# Patient Record
Sex: Male | Born: 1955 | Race: Black or African American | Hispanic: No | Marital: Married | Smoking: Never smoker
Health system: Southern US, Community
[De-identification: ages and names within clinical notes are randomized; demographics above are authoritative.]

## PROBLEM LIST (undated history)

## (undated) DIAGNOSIS — I10 Essential (primary) hypertension: Secondary | ICD-10-CM

## (undated) DIAGNOSIS — E875 Hyperkalemia: Secondary | ICD-10-CM

## (undated) DIAGNOSIS — E119 Type 2 diabetes mellitus without complications: Secondary | ICD-10-CM

## (undated) DIAGNOSIS — E785 Hyperlipidemia, unspecified: Secondary | ICD-10-CM

## (undated) DIAGNOSIS — E663 Overweight: Secondary | ICD-10-CM

## (undated) DIAGNOSIS — M109 Gout, unspecified: Secondary | ICD-10-CM

## (undated) DIAGNOSIS — M2141 Flat foot [pes planus] (acquired), right foot: Secondary | ICD-10-CM

## (undated) DIAGNOSIS — K635 Polyp of colon: Secondary | ICD-10-CM

## (undated) DIAGNOSIS — N189 Chronic kidney disease, unspecified: Secondary | ICD-10-CM

## (undated) HISTORY — PX: HERNIA REPAIR: SHX51

## (undated) HISTORY — DX: Chronic kidney disease, unspecified: N18.9

## (undated) HISTORY — DX: Flat foot (pes planus) (acquired), right foot: M21.41

## (undated) HISTORY — DX: Polyp of colon: K63.5

## (undated) HISTORY — DX: Type 2 diabetes mellitus without complications: E11.9

## (undated) HISTORY — DX: Gout, unspecified: M10.9

## (undated) HISTORY — DX: Essential (primary) hypertension: I10

## (undated) HISTORY — PX: COLONOSCOPY: SHX174

## (undated) HISTORY — DX: Overweight: E66.3

## (undated) HISTORY — DX: Hyperkalemia: E87.5

## (undated) HISTORY — DX: Hyperlipidemia, unspecified: E78.5

---

## 2009-01-10 ENCOUNTER — Ambulatory Visit: Payer: Self-pay | Admitting: General Surgery

## 2009-01-17 ENCOUNTER — Ambulatory Visit: Payer: Self-pay | Admitting: General Surgery

## 2013-03-23 ENCOUNTER — Ambulatory Visit: Payer: Self-pay | Admitting: General Practice

## 2013-03-23 IMAGING — CR DG KNEE COMPLETE 4+V*R*
1 series · 5 of 5 positions shown · non-contrast
Comparison: none

REASON FOR EXAM: pain in Rt knee injury fax result to Dr. CELESTINE
COMMENTS:

[Series 1: t knee obl right · 0.14mm/px · 5 of 5 slices shown]
[im 1/5]
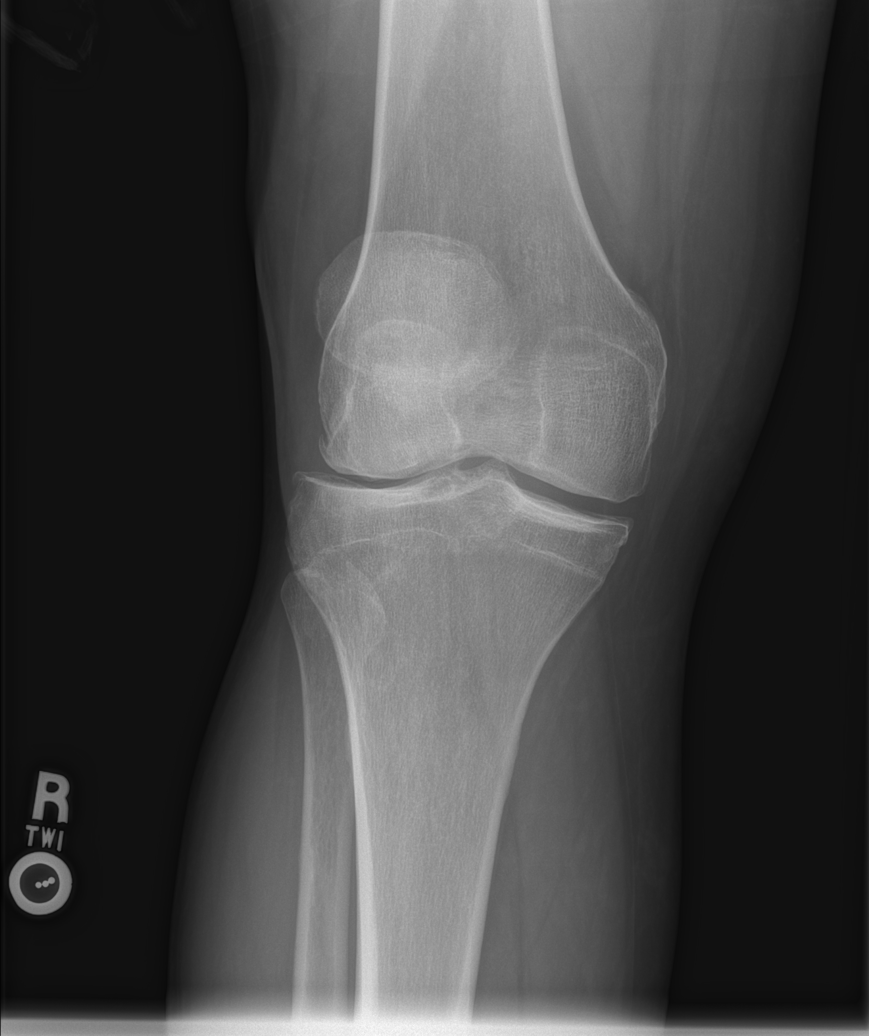
[im 2/5]
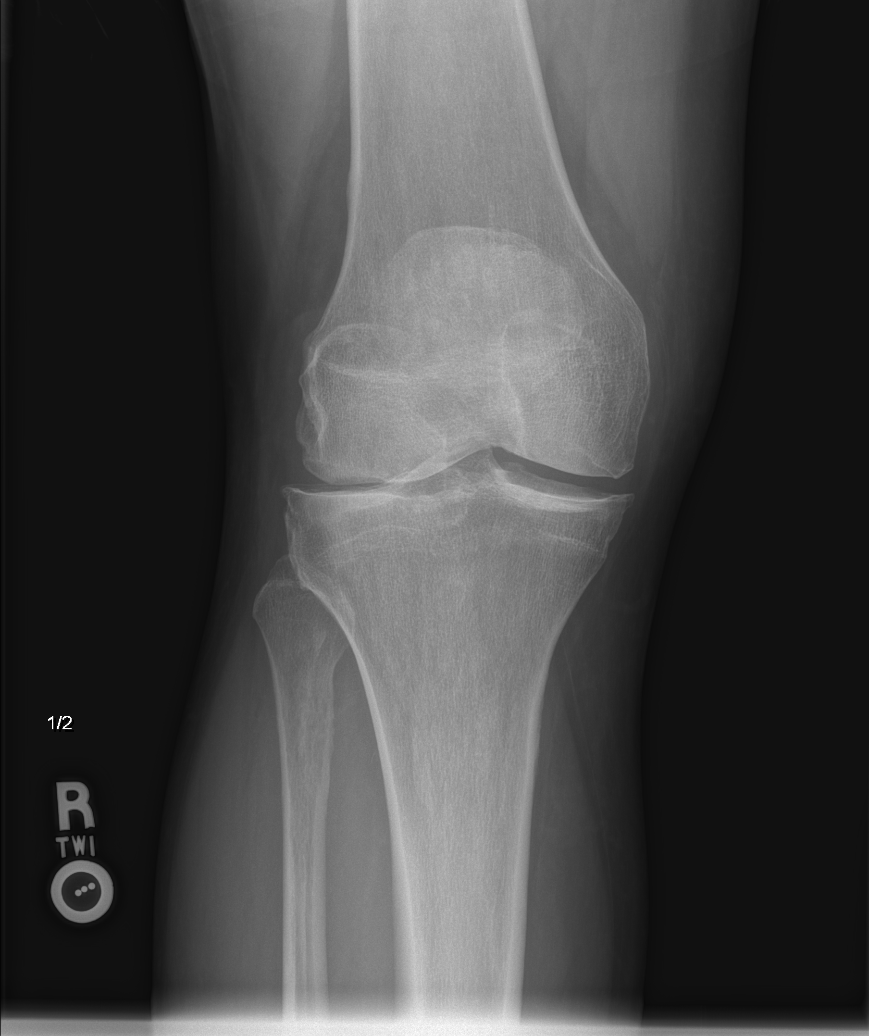
[im 3/5]
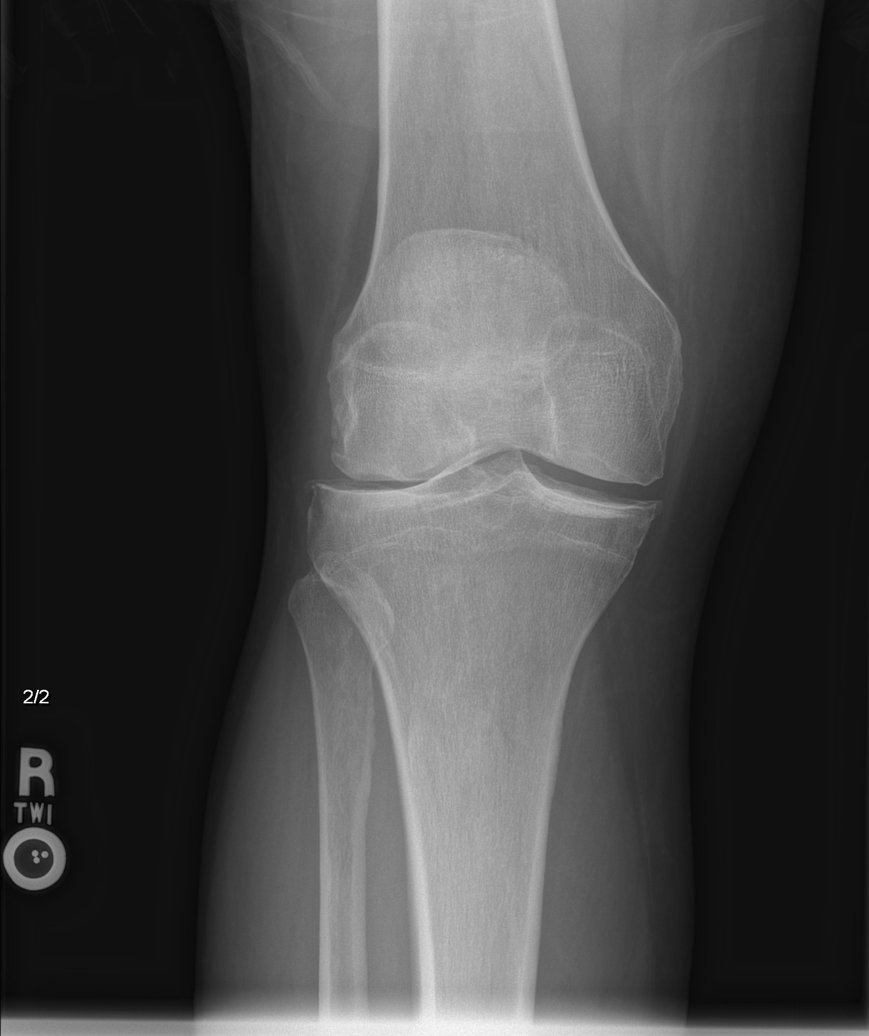
[im 4/5]
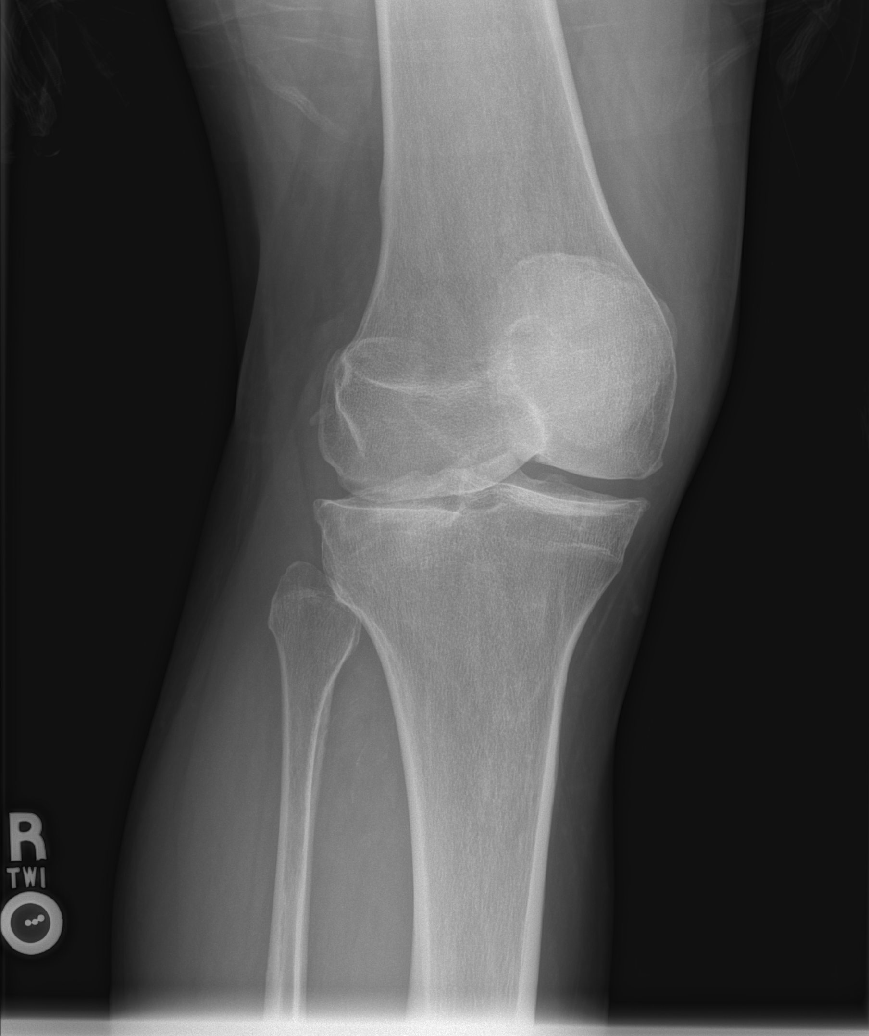
[im 5/5]
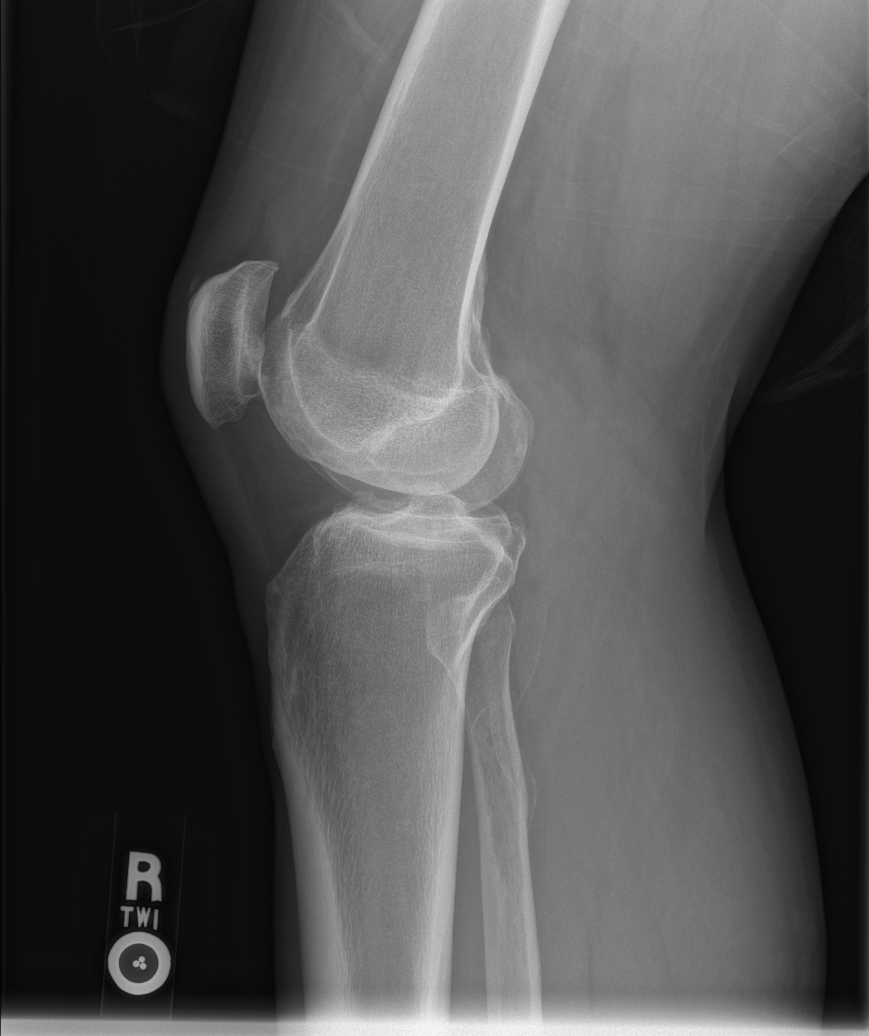

[5 of 5 positions shown; findings below may reference images not displayed]

PROCEDURE:     DXR - DXR KNEE RT COMP WITH OBLIQUES  - [DATE]  [DATE]

RESULT:     Right knee images demonstrate significant atherosclerotic
calcification. There is some degenerative joint space narrowing especially
medially and in the patellofemoral region without evidence of fracture,
dislocation or foreign body.
IMPRESSION: Please see above.

[REDACTED]

## 2015-04-25 ENCOUNTER — Ambulatory Visit (INDEPENDENT_AMBULATORY_CARE_PROVIDER_SITE_OTHER): Payer: BLUE CROSS/BLUE SHIELD | Admitting: Podiatry

## 2015-04-25 ENCOUNTER — Ambulatory Visit (INDEPENDENT_AMBULATORY_CARE_PROVIDER_SITE_OTHER): Payer: BLUE CROSS/BLUE SHIELD

## 2015-04-25 ENCOUNTER — Encounter: Payer: Self-pay | Admitting: Podiatry

## 2015-04-25 VITALS — BP 137/88 | HR 80 | Resp 16 | Ht 71.0 in | Wt 262.0 lb

## 2015-04-25 DIAGNOSIS — M2142 Flat foot [pes planus] (acquired), left foot: Secondary | ICD-10-CM

## 2015-04-25 DIAGNOSIS — M19072 Primary osteoarthritis, left ankle and foot: Secondary | ICD-10-CM | POA: Diagnosis not present

## 2015-04-25 DIAGNOSIS — R52 Pain, unspecified: Secondary | ICD-10-CM

## 2015-04-25 DIAGNOSIS — M2141 Flat foot [pes planus] (acquired), right foot: Secondary | ICD-10-CM | POA: Diagnosis not present

## 2015-04-25 MED ORDER — MELOXICAM 7.5 MG PO TABS: 7.5000 mg | ORAL_TABLET | Freq: Every day | ORAL | Status: DC

## 2015-04-25 NOTE — Progress Notes (Signed)
Subjective:     Patient ID: Ernest [name], male   DOB: Jan 10, 1956, 59 y.o.   MRN: [id number]  HPI 59 year old male presents the office today for complaints of left foot pain which is been ongoing for greater than 6 months. It's that she has pain in the top of his foot mostly outside aspect which is mostly when trimmed put pressure on an prolonged ambulation. He denies any history of injury or trauma to the area. He denies any swelling or redness. Denies any numbness or tingling. He is diabetic and states his last blood sugar check yesterday was approximately 130. He said no prior treatment other than taking Advil which seems to help. No other complaints at this time.  Review of Systems  All other systems reviewed and are negative.      Objective:   Physical Exam AAO x3, NAD DP/PT pulses palpable bilaterally, CRT less than 3 seconds Protective sensation intact with Simms Weinstein monofilament, vibratory sensation intact, Achilles tendon reflex intact There is tenderness to palpation on the dorsal aspect of the midfoot along Lisfranc's joint was on the lateral aspect on the left side. There is no specific area pinpoint bony tenderness or pain the vibratory sensation. There is no overlying edema, erythema, increase in warmth bilaterally. No other areas of tenderness to bilateral lower extremities. There is a decrease in medial arch height upon weightbearing. Equinus is present. MMT 5/5, ROM WNL.  No open lesions or pre-ulcerative lesions.  No pain with calf compression, swelling, warmth, erythema bilaterally.      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 Arthritic changes are present on the midfoot. There is also some vessel calcification present. He does have palpable pulses and pedal hair is present. Circulation appears to be adequate. However he continues to have symptoms will  likely have arterial studies to confirm circulation. -Treatment options discussed including all alternatives, risks, and complications -Discussed likely etiology of his symptoms. -I would that he would benefit from arch supports to help support the arch of the foot to help take pressure off the midfoot. -I discussed steroid injection over the area of maximal alternatives. I discussed risks and come locations for which she understands and wishes to proceed. Under sterile conditions a total of 1 mL mixture of dexamethasone phosphate and 0.5% Marcaine plain was infiltrated into the area of maximal tenderness on the dorsal aspect of the midfoot. A Band-Aid was applied. Postinjection care was discussed the patient. Monitor blood sugar. -Prescribed mobic. Discussed side effects of the medication and directed to stop if any are to occur and call the office.  -Follow-up 6 weeks or sooner if any problems arise. In the meantime, encouraged to call the office with any questions, concerns, change in symptoms.   Ovid Curd, DPM

## 2015-06-06 ENCOUNTER — Ambulatory Visit: Payer: BLUE CROSS/BLUE SHIELD | Admitting: Podiatry

## 2015-06-06 ENCOUNTER — Ambulatory Visit (INDEPENDENT_AMBULATORY_CARE_PROVIDER_SITE_OTHER): Payer: BLUE CROSS/BLUE SHIELD | Admitting: Podiatry

## 2015-06-06 ENCOUNTER — Encounter: Payer: Self-pay | Admitting: Podiatry

## 2015-06-06 VITALS — BP 132/83 | HR 105 | Resp 18

## 2015-06-06 DIAGNOSIS — M2040 Other hammer toe(s) (acquired), unspecified foot: Secondary | ICD-10-CM

## 2015-06-06 DIAGNOSIS — M19072 Primary osteoarthritis, left ankle and foot: Secondary | ICD-10-CM

## 2015-06-06 DIAGNOSIS — M79673 Pain in unspecified foot: Secondary | ICD-10-CM

## 2015-06-06 NOTE — Progress Notes (Signed)
Patient ID: Ernest [name], male   DOB: 08/21/1956, 59 y.o.   MRN: [id number]  Subjective: 59 year old male presents the office they for follow-up evaluation of left foot pain. He states that he had significant improvement in his pain after the injection lasted up until about one week ago. He states his pain is moved more towards his toes at this time. He has pain to the top of his toes protective weightbearing and pressure. Denies any recent injury or trauma. No swelling or redness. No other complaints at this time. He has not yet purchased inserts.  Objective: AAO 3, NAD DP/PT pulses palpable, CRT less than 3 seconds Protective sensation intact with Simms Weinsteinmonofilament There is tenderness on the dorsal aspect of the MTPJ left foot, without any pain on the plantar aspect. There is no pain with MPJ range of motion. There is rigid hammertoe contractures of the lesser digits. There is prominent metatarsal heads with atrophy of the fat pad. There is no area pinpoint bony tenderness or pain the vibratory sensation.there is no pain on the dorsal midfoot that was present last appointment. There is no overlying edema, erythema, increase in warmth.no open lesions or pre-ulcerative lesions. No pain with calf compression, swelling, warmth, erythema.  Assessment: 59 year old male with MTPJ pain likely as a result of rigid hammertoe contractures/deformity.  Plan: -Treatment options discussed including all alternatives, risks, and complications -metatarsal pads applied. -Again I discussed with him orthotics as ably this will help control his foot structure and help take pressure off the metatarsal heads. He did purchase power steps today. Dispense metatarsal pads. -Anti-inflammatories as needed -Follow-up in 4 weeks if symptoms continue or sooner if any problems arise. In the meantime, encouraged to call the office with any questions, concerns, change in symptoms.   Ovid Curd, DPM

## 2015-07-18 ENCOUNTER — Ambulatory Visit: Payer: BLUE CROSS/BLUE SHIELD | Admitting: Podiatry

## 2015-07-20 ENCOUNTER — Encounter: Payer: Self-pay | Admitting: Podiatry

## 2015-07-20 ENCOUNTER — Ambulatory Visit (INDEPENDENT_AMBULATORY_CARE_PROVIDER_SITE_OTHER): Payer: BLUE CROSS/BLUE SHIELD | Admitting: Podiatry

## 2015-07-20 VITALS — BP 129/86 | HR 87 | Resp 18

## 2015-07-20 DIAGNOSIS — M2040 Other hammer toe(s) (acquired), unspecified foot: Secondary | ICD-10-CM

## 2015-07-20 DIAGNOSIS — M779 Enthesopathy, unspecified: Secondary | ICD-10-CM | POA: Diagnosis not present

## 2015-07-20 MED ORDER — DICLOFENAC SODIUM 75 MG PO TBEC: 75.0000 mg | DELAYED_RELEASE_TABLET | Freq: Two times a day (BID) | ORAL | Status: DC

## 2015-07-20 NOTE — Progress Notes (Signed)
Patient ID: Ernest [name], male   DOB: 11/05/1955, 59 y.o.   MRN: [id number]   Subjective: 59 year old male presents the office today for follow up evaluation of left foot pain. he states that overall he is doing better. He has had some good days and bad days. He does continue of pain to the ball of his foot mostly along the third toe joint. He does continue with the orthotics and the pads which he states helps. He denies any recent injury or trauma. No swelling or redness. No other complaints at this time.  Objective: AAO 3, NAD DP/PT pulses 2/4, CRT less than 3 seconds Protective sensation intact with Simms Weinstein monofilament There is continued tenderness the dorsal aspect of the MPJ of the left foot mostly on the third MPJ. There is no pain or crepitation MPJ range of motion. There is no specific area pinpoint bony tenderness or pain the vibratory sensation. There is no overlying edema, erythema, increase in warmth bilaterally. MMT 5/5, ROM WNL. Hammertoe contractures present.  No open lesions or pre-ulcer lesions identified bilaterally. No pain with calf compression, swelling, warmth, erythema.  Assessment: 59 year old male capsulitis, metatarsalgia  Plan: -Treatment options discussed including all alternatives, risks, and complications -I discussed a steroid injection around the area of maximal tenderness. I discussed with him risks and complications for which she understands and verbally consents. Under sterile conditions a total of 1 mL mixture of dexamethasone phosphate and 2% lidocaine plain was infiltrated into and around the third MTPJ on the area of maximal tenderness without any couple complications. He tolerated the injection well any, complications. Post injection care discussed. -Continue with inserts offloading pads. -Follow up with Simms does not resolve the next 4-6 weeks or sooner if any palms are to arise. Otherwise follow-up as needed. Call any questions or concerns in  the meantime.  Ovid CurdMatthew Wagoner, DPM

## 2015-08-10 ENCOUNTER — Other Ambulatory Visit: Payer: Self-pay | Admitting: Podiatry

## 2015-08-10 NOTE — Telephone Encounter (Signed)
Pt needs an appt prior to refills if continuing to have problems. 

## 2015-08-31 ENCOUNTER — Ambulatory Visit: Payer: BLUE CROSS/BLUE SHIELD | Admitting: Podiatry

## 2016-03-13 ENCOUNTER — Other Ambulatory Visit: Payer: Self-pay | Admitting: Physician Assistant

## 2016-07-11 IMAGING — US US EXTREM LOW VENOUS*L*
1 series · 13 of 24 positions shown · non-contrast
Comparison: None.

CLINICAL DATA: Left leg pain and swelling for 1 week.



[Series 1: us extrem low venous*left* · 0.08mm/px · 13 of 44 slices shown]
[im 1/44]
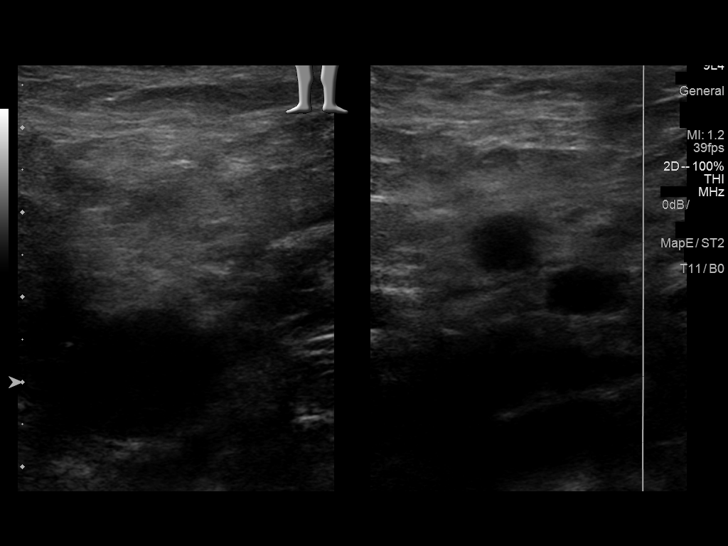
[im 4/44]
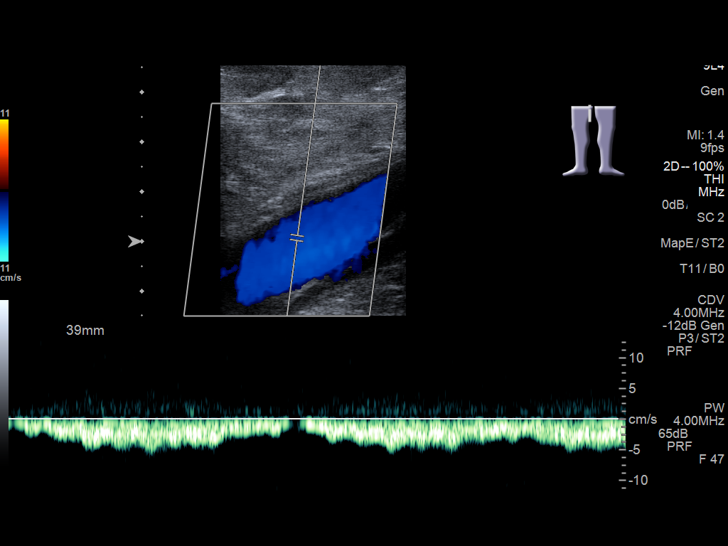
[im 8/44]
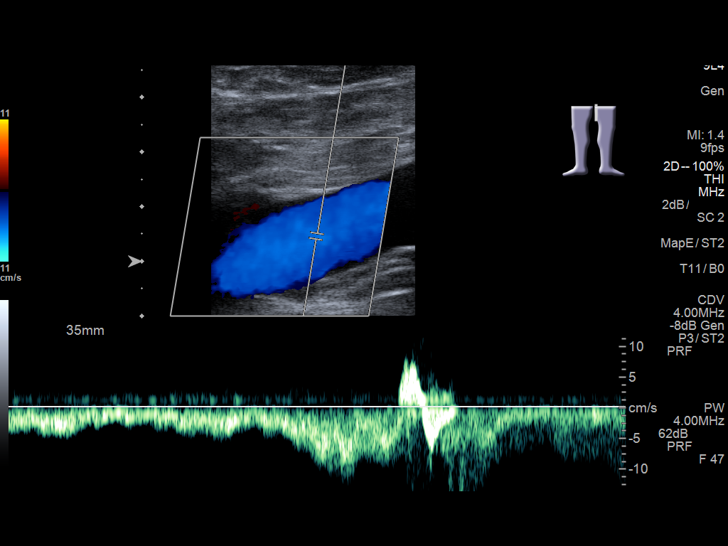
[im 12/44]
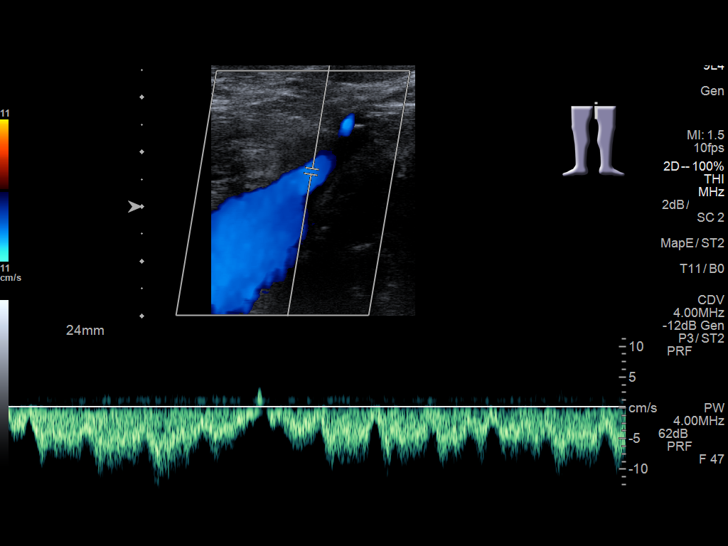
[im 15/44]
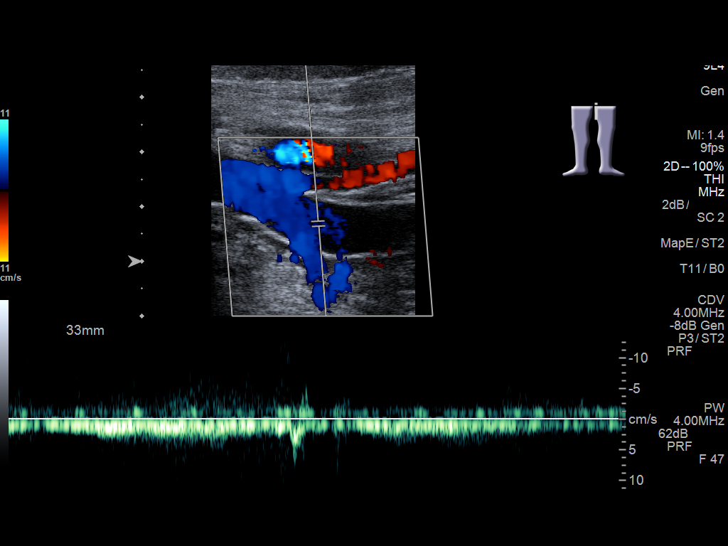
[im 19/44]
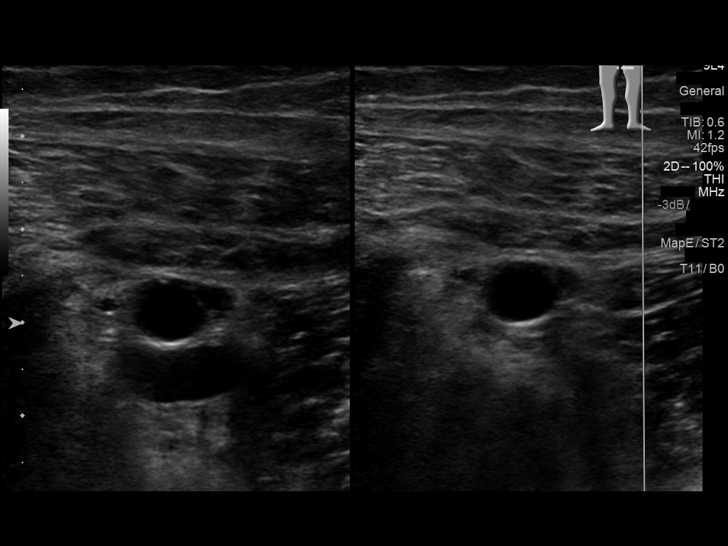
[im 23/44]
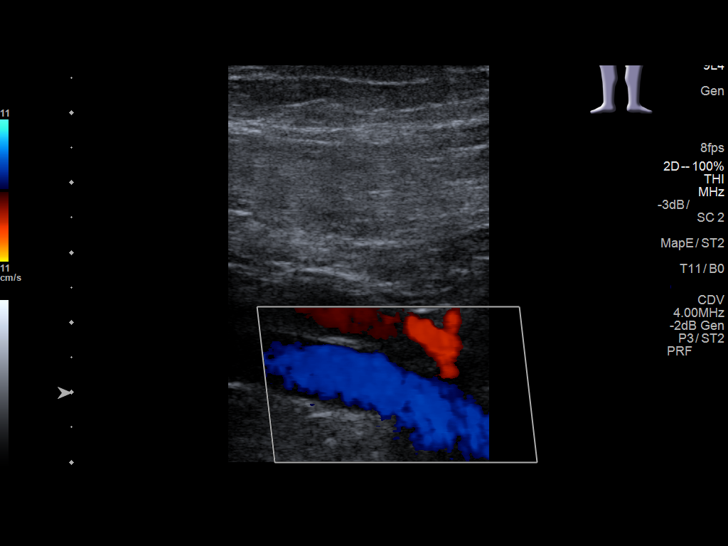
[im 25/44]
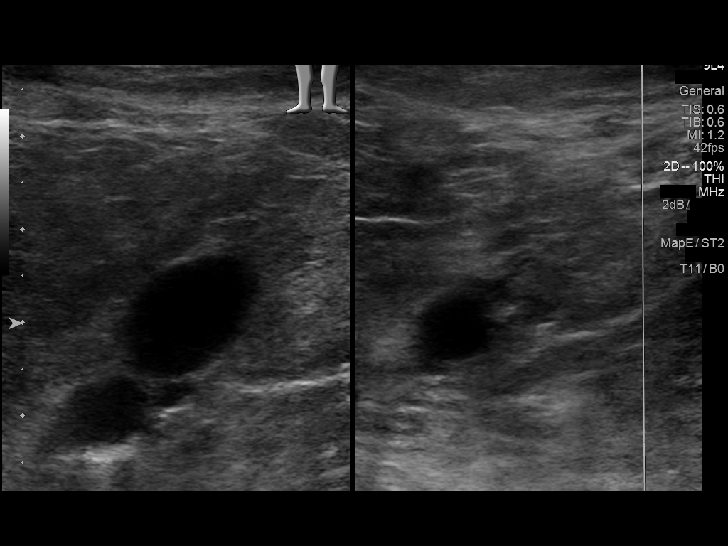
[im 29/44]
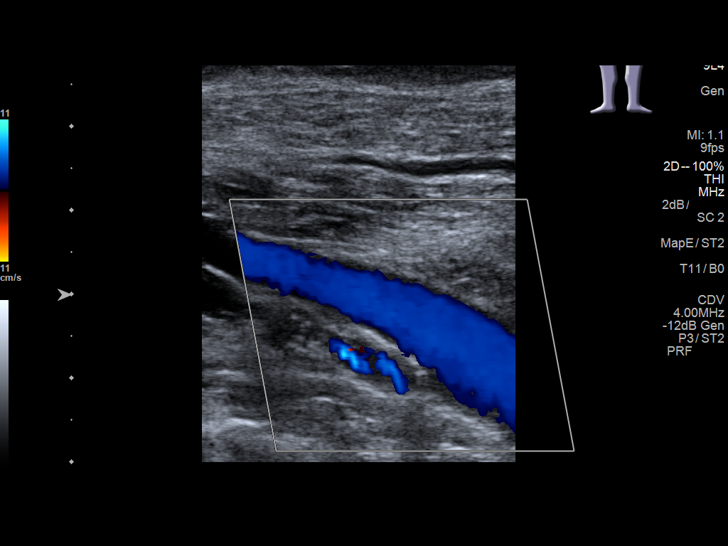
[im 32/44]
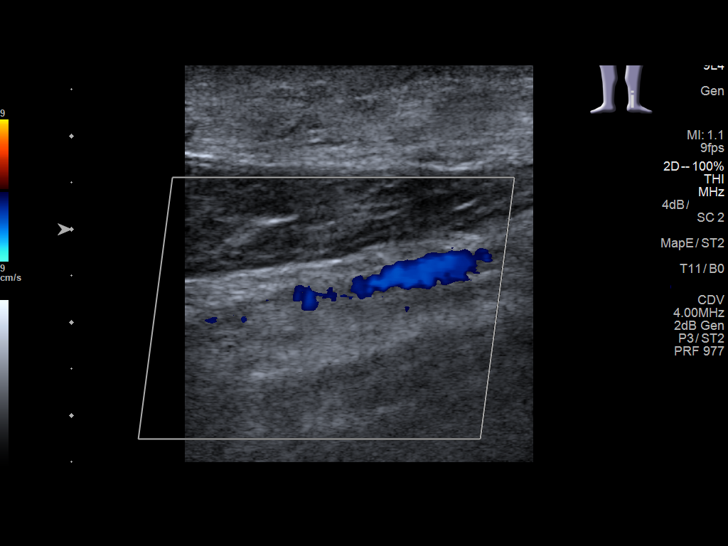
[im 36/44]
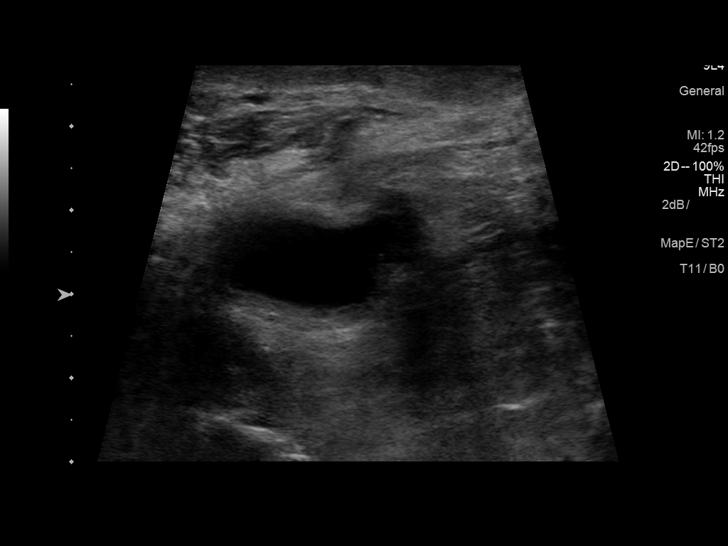
[im 40/44]
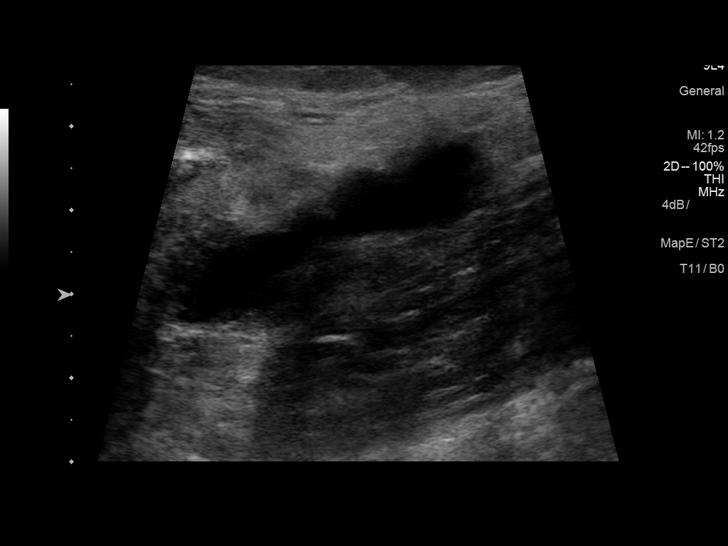
[im 44/44]
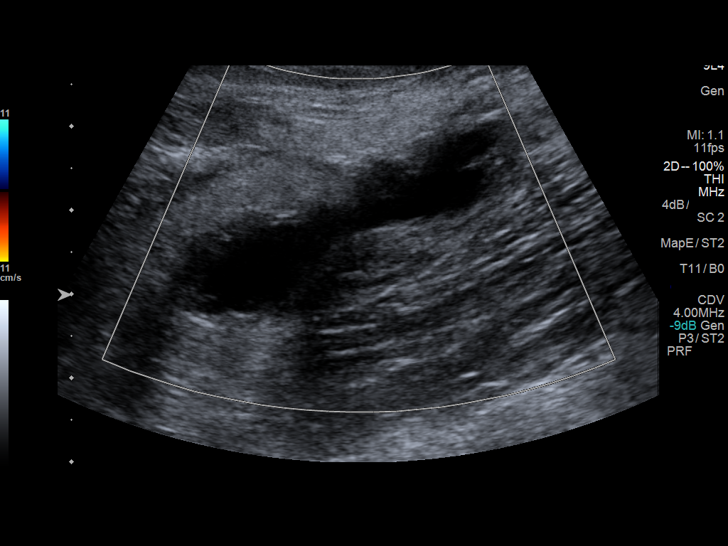

[13 of 24 positions shown; findings below may reference images not displayed]

FINDINGS: Contralateral Common Femoral Vein: Respiratory phasicity is normal
and symmetric with the symptomatic side. No evidence of thrombus.
Normal compressibility.

Common Femoral Vein: No evidence of thrombus. Normal
compressibility, respiratory phasicity and response to augmentation.

Saphenofemoral Junction: No evidence of thrombus. Normal
compressibility and flow on color Doppler imaging.

Profunda Femoral Vein: No evidence of thrombus. Normal
compressibility and flow on color Doppler imaging.

Femoral Vein: No evidence of thrombus. Normal compressibility,
respiratory phasicity and response to augmentation.

Popliteal Vein: No evidence of thrombus. Normal compressibility,
respiratory phasicity and response to augmentation.

Calf Veins: No evidence of thrombus. Normal compressibility and flow
on color Doppler imaging.

Superficial Great Saphenous Vein: No evidence of thrombus. Normal
compressibility and flow on color Doppler imaging.

Venous Reflux:  None.

Other Findings: Probable Baker's cyst measuring 9.0 x 4.1 x 2.5 cm
is noted.
IMPRESSION: No evidence of deep venous thrombosis seen in left lower extremity.
Baker's cyst seen in left popliteal fossa.

## 2016-09-25 ENCOUNTER — Emergency Department
Admission: EM | Admit: 2016-09-25 | Discharge: 2016-09-25 | Disposition: A | Discharge status: Home / Self Care | Payer: BLUE CROSS/BLUE SHIELD | Attending: Emergency Medicine | Admitting: Emergency Medicine

## 2016-09-25 ENCOUNTER — Emergency Department: Payer: BLUE CROSS/BLUE SHIELD

## 2016-09-25 DIAGNOSIS — M660 Rupture of popliteal cyst: Secondary | ICD-10-CM | POA: Insufficient documentation

## 2016-09-25 DIAGNOSIS — Z7984 Long term (current) use of oral hypoglycemic drugs: Secondary | ICD-10-CM | POA: Diagnosis not present

## 2016-09-25 DIAGNOSIS — Z79899 Other long term (current) drug therapy: Secondary | ICD-10-CM | POA: Insufficient documentation

## 2016-09-25 DIAGNOSIS — I10 Essential (primary) hypertension: Secondary | ICD-10-CM | POA: Insufficient documentation

## 2016-09-25 DIAGNOSIS — M7989 Other specified soft tissue disorders: Secondary | ICD-10-CM | POA: Diagnosis present

## 2016-09-25 DIAGNOSIS — E119 Type 2 diabetes mellitus without complications: Secondary | ICD-10-CM | POA: Insufficient documentation

## 2016-09-25 MED ORDER — OXYCODONE-ACETAMINOPHEN 5-325 MG PO TABS: 2.0000 | ORAL_TABLET | Freq: Four times a day (QID) | ORAL | 0 refills | Status: DC | PRN

## 2016-09-25 NOTE — ED Triage Notes (Signed)
Reports left leg swelling and pain in calf. +PMS to foot.

## 2016-09-25 NOTE — ED Provider Notes (Signed)
[facility] Emergency Department Provider Note        Time seen: ----------------------------------------- 7:27 PM on 09/25/2016 -----------------------------------------    I have reviewed the triage vital signs and the nursing notes.   HISTORY  Chief Complaint Leg Pain    HPI Ernest [name] is a 60 y.o. male who presents to the ER for left leg swelling and pain in his calf.Pain is been present for several days, initially he states it felt like a cramp in the lower part of his upper leg on the left. Patient states he tried to stretch it but the pain persisted. He notes swelling in the left leg that worse when he is up and walking. Patient states when he lays down at night the fluid seems to go away. His also had bruising in the left lower leg and ankle   Past Medical History:  Diagnosis Date  . Diabetes mellitus without complication (HCC)   . Hypertension     There are no active problems to display for this patient.   No past surgical history on file.  Allergies Patient has no known allergies.  Social History Social History  Substance Use Topics  . Smoking status: Never Smoker  . Smokeless tobacco: Not on file  . Alcohol use Not on file    Review of Systems Constitutional: Negative for fever. Cardiovascular: Negative for chest pain. Respiratory: Negative for shortness of breath. Gastrointestinal: Negative for abdominal pain, vomiting and diarrhea. Musculoskeletal: Positive for left leg swelling and pain Skin: Positive for ecchymosis Neurological: Negative for headaches, focal weakness or numbness.  10-point ROS otherwise negative.  ____________________________________________   PHYSICAL EXAM:  VITAL SIGNS: ED Triage Vitals  Enc Vitals Group     BP 09/25/16 1708 (!) 143/92     Pulse Rate 09/25/16 1708 95     Resp 09/25/16 1708 18     Temp 09/25/16 1708 98.8 F (37.1 C)     Temp Source 09/25/16 1708 Oral     SpO2 09/25/16  1708 98 %     Weight 09/25/16 1706 265 lb (120.2 kg)     Height 09/25/16 1706 5\' 9"  (1.753 m)     Head Circumference --      Peak Flow --      Pain Score 09/25/16 1707 3     Pain Loc --      Pain Edu? --      Excl. in GC? --     Constitutional: Alert and oriented. Well appearing and in no distress. Eyes: Conjunctivae are normal. PERRL. Normal extraocular movements. ENT   Head: Normocephalic and atraumatic.   Nose: No congestion/rhinnorhea.   Mouth/Throat: Mucous membranes are moist.   Neck: No stridor. Cardiovascular: Normal rate, regular rhythm. No murmurs, rubs, or gallops. Respiratory: Normal respiratory effort without tachypnea nor retractions. Breath sounds are clear and equal bilaterally. No wheezes/rales/rhonchi. Gastrointestinal: Soft and nontender. Normal bowel sounds Musculoskeletal: Mild pain with range of motion of lower extremity. Tenderness over the ankle, ecchymosis and pitting edema as noted in the left lower leg. He has ecchymosis noted around the left ankle, also present around the distal left upper leg posteriorly Neurologic:  Normal speech and language. No gross focal neurologic deficits are appreciated.  Skin:  Ecchymosis as dictated above ____________________________________________  ED COURSE:  Pertinent labs & imaging results that were available during my care of the patient were reviewed by me and considered in my medical decision making (see chart for details). Clinical Course  Patient presents to ER for left leg edema and ecchymosis. We will assess with ultrasound and reevaluate.  Procedures ____________________________________________   RADIOLOGY Lower extremity ultrasound IMPRESSION: No evidence of deep venous thrombosis seen in left lower extremity. Baker's cyst seen in left popliteal fossa.  ____________________________________________  FINAL ASSESSMENT AND PLAN  Baker's cyst  Plan: Patient with labs and imaging as dictated  above. Patient likely with ruptured Baker's cyst and symptoms secondary to same. He will be referred to orthopedics for outpatient follow-up. Right lower extremity is otherwise neurovascularly intact. He is stable for discharge.   Emily FilbertWilliams, Safari Cinque E, MD   Note: This dictation was prepared with Dragon dictation. Any transcriptional errors that result from this process are unintentional    Emily FilbertJonathan E Tyerra Loretto, MD 09/25/16 1945

## 2016-09-26 ENCOUNTER — Other Ambulatory Visit: Payer: Self-pay | Admitting: Physician Assistant

## 2016-10-08 ENCOUNTER — Other Ambulatory Visit: Payer: Self-pay | Admitting: Physician Assistant

## 2017-07-02 ENCOUNTER — Ambulatory Visit
Admission: RE | Admit: 2017-07-02 | Discharge: 2017-07-02 | Disposition: A | Discharge status: Home / Self Care | Payer: BLUE CROSS/BLUE SHIELD | Source: Ambulatory Visit | Attending: Family Medicine | Admitting: Family Medicine

## 2017-07-02 ENCOUNTER — Other Ambulatory Visit: Payer: Self-pay | Admitting: Family Medicine

## 2017-07-02 DIAGNOSIS — M65811 Other synovitis and tenosynovitis, right shoulder: Secondary | ICD-10-CM | POA: Diagnosis not present

## 2017-07-02 DIAGNOSIS — M25551 Pain in right hip: Secondary | ICD-10-CM | POA: Diagnosis present

## 2017-07-02 DIAGNOSIS — M25511 Pain in right shoulder: Secondary | ICD-10-CM | POA: Insufficient documentation

## 2017-07-02 DIAGNOSIS — M1611 Unilateral primary osteoarthritis, right hip: Secondary | ICD-10-CM | POA: Diagnosis not present

## 2017-07-02 IMAGING — CR DG HIP (WITH OR WITHOUT PELVIS) 2-3V*R*
1 series · 3 of 3 positions shown · non-contrast
Comparison: None.

CLINICAL DATA: Right hip pain, 8 months duration.

EXAM:
DG HIP (WITH OR WITHOUT PELVIS) 2-3V RIGHT

[Series 1: dg hip unilat w or w/o pelvis 2-3 views  · non-contrast · 0.14mm/px · 3 of 3 slices shown]
[im 1/3]
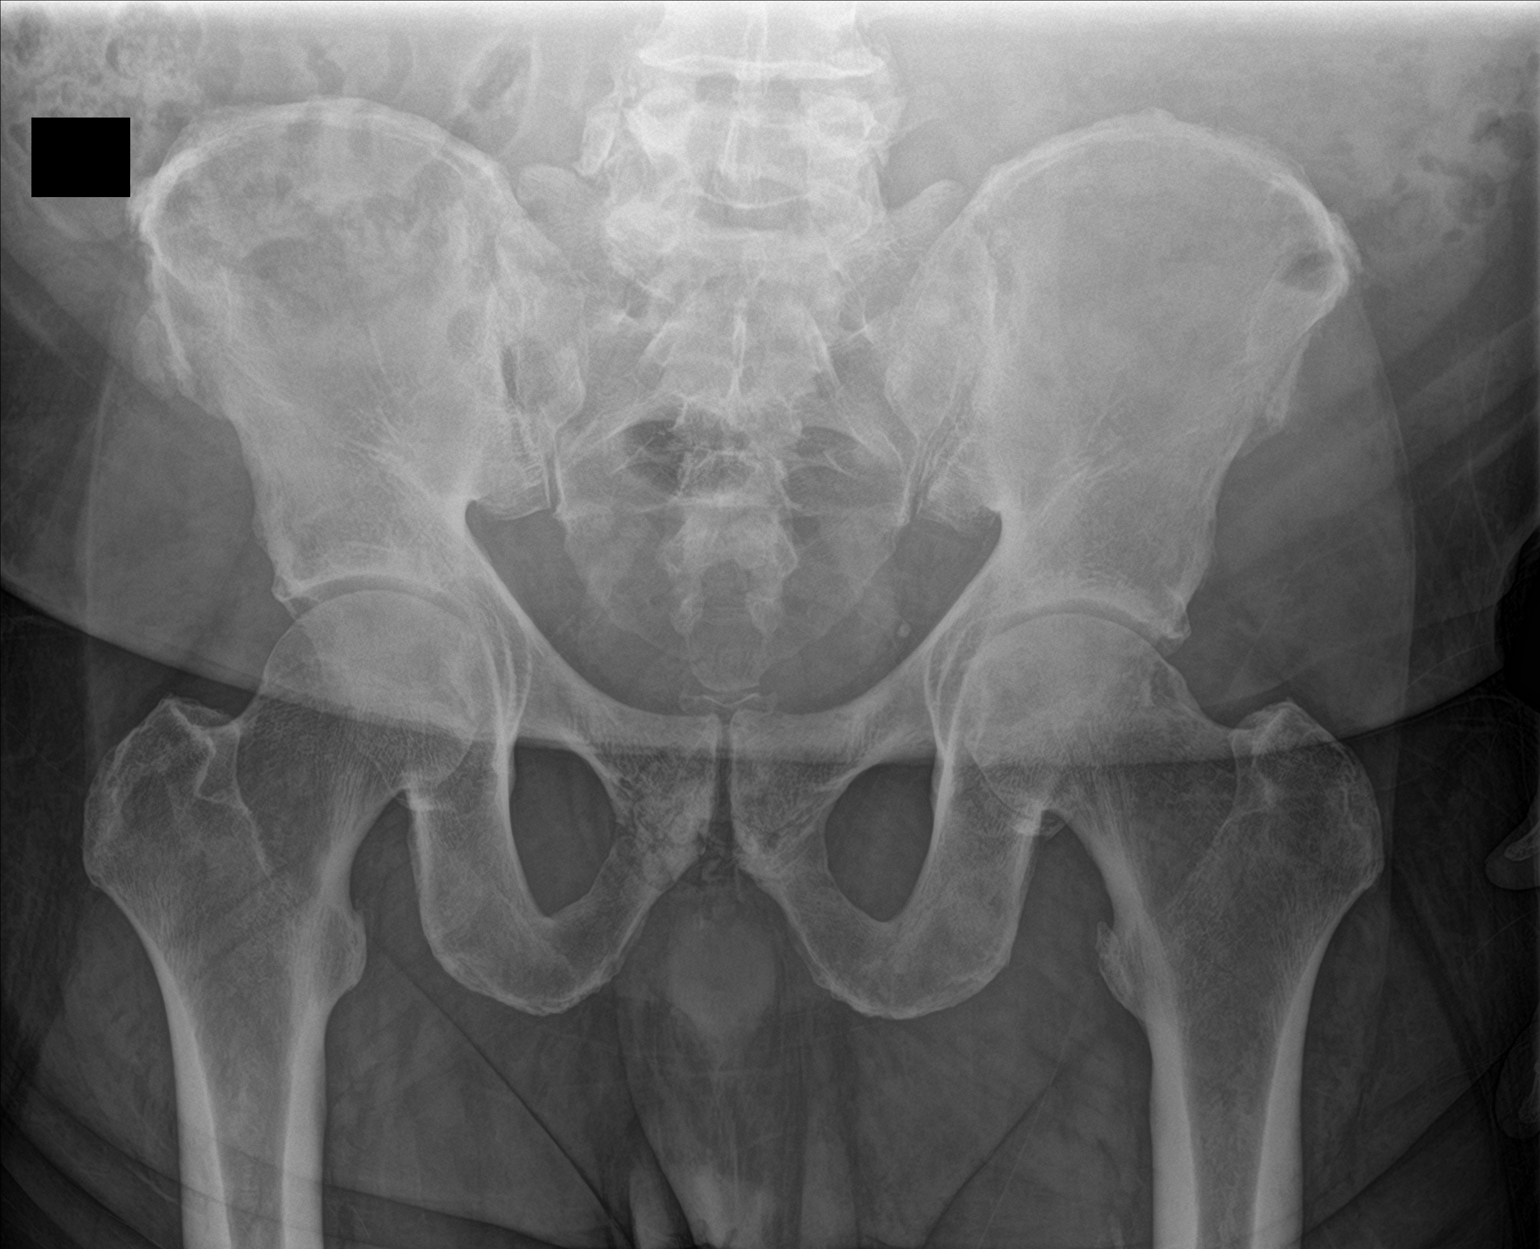
[im 2/3]
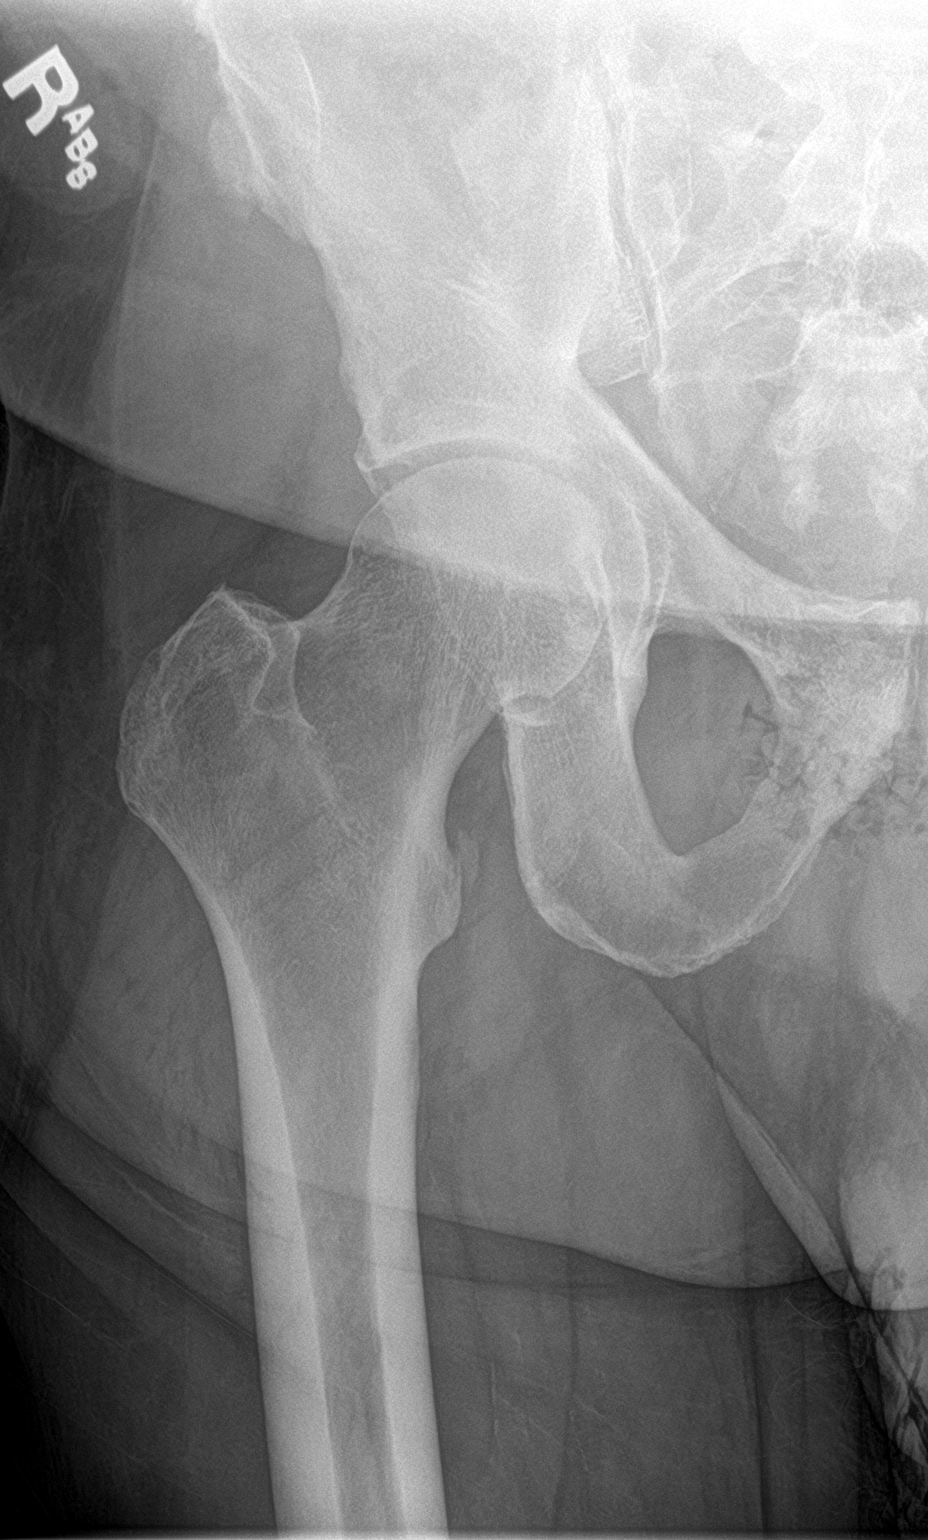
[im 3/3]
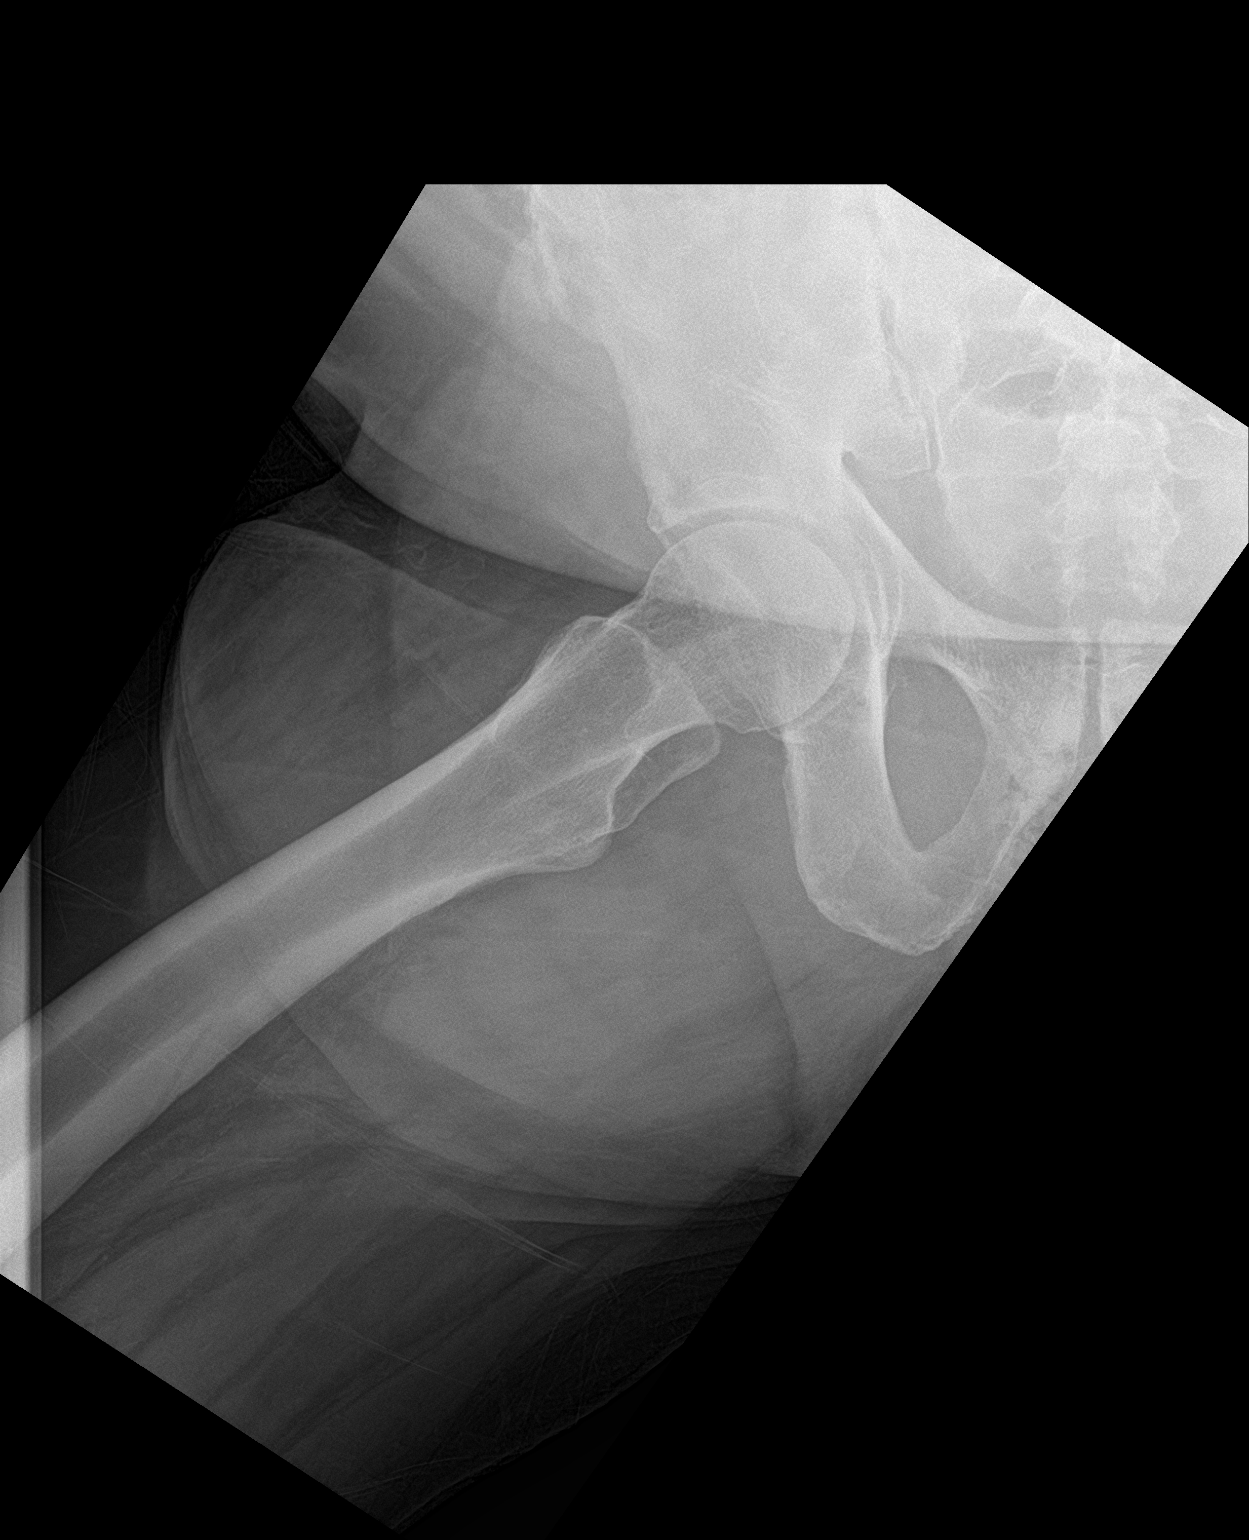

[3 of 3 positions shown; findings below may reference images not displayed]

FINDINGS: Right hip does not show joint space narrowing or osteophyte
formation. There are moderate degenerative changes of the left hip
with joint space narrowing and osteophyte formation. Symphysis pubis
and sacroiliac joints appear normal. No focal bone lesion.
IMPRESSION: Normal appearance of the right hip. Moderate degenerative changes of
the left hip.

## 2017-07-02 IMAGING — CR DG SHOULDER 2+V*R*
1 series · 3 of 3 positions shown · non-contrast
Comparison: None.

CLINICAL DATA: Right shoulder pain, 3 months duration.

EXAM:
RIGHT SHOULDER - 2+ VIEW

[Series 1: dg shoulder right · 0.14mm/px · 3 of 3 slices shown]
[im 1/3]
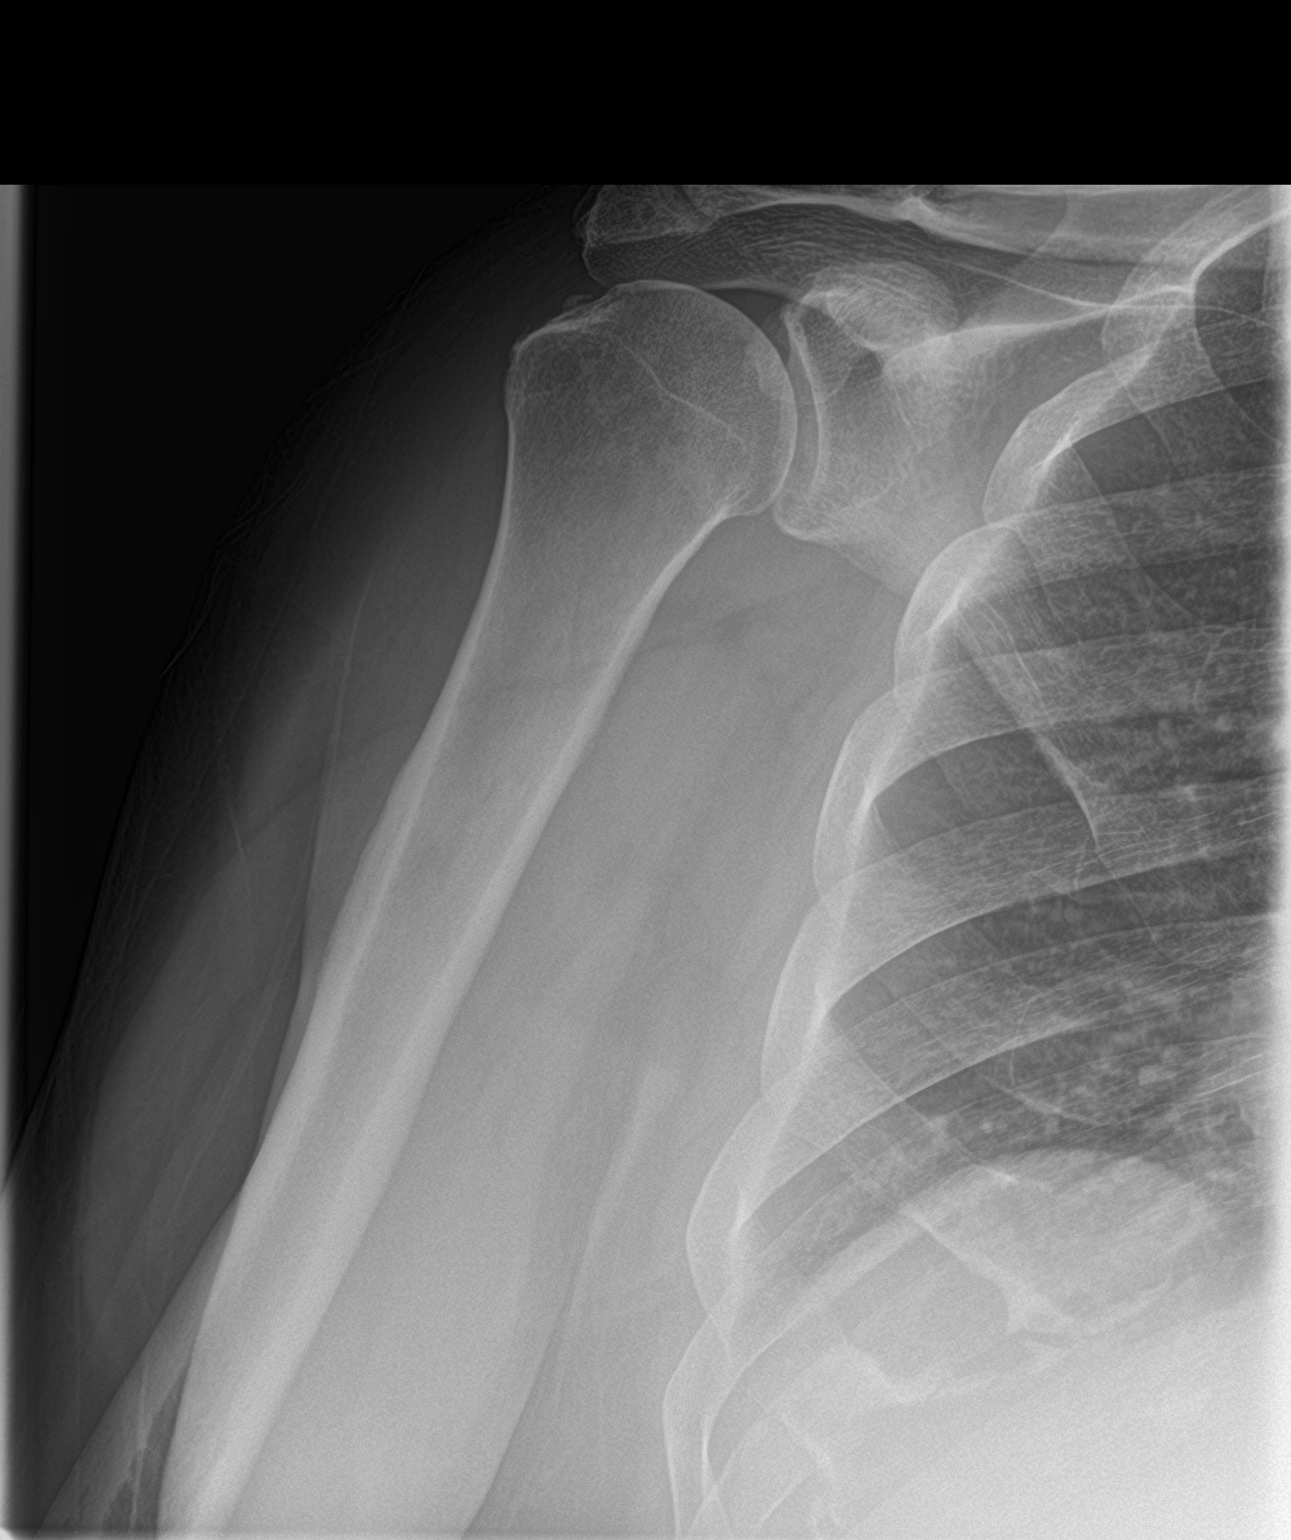
[im 2/3]
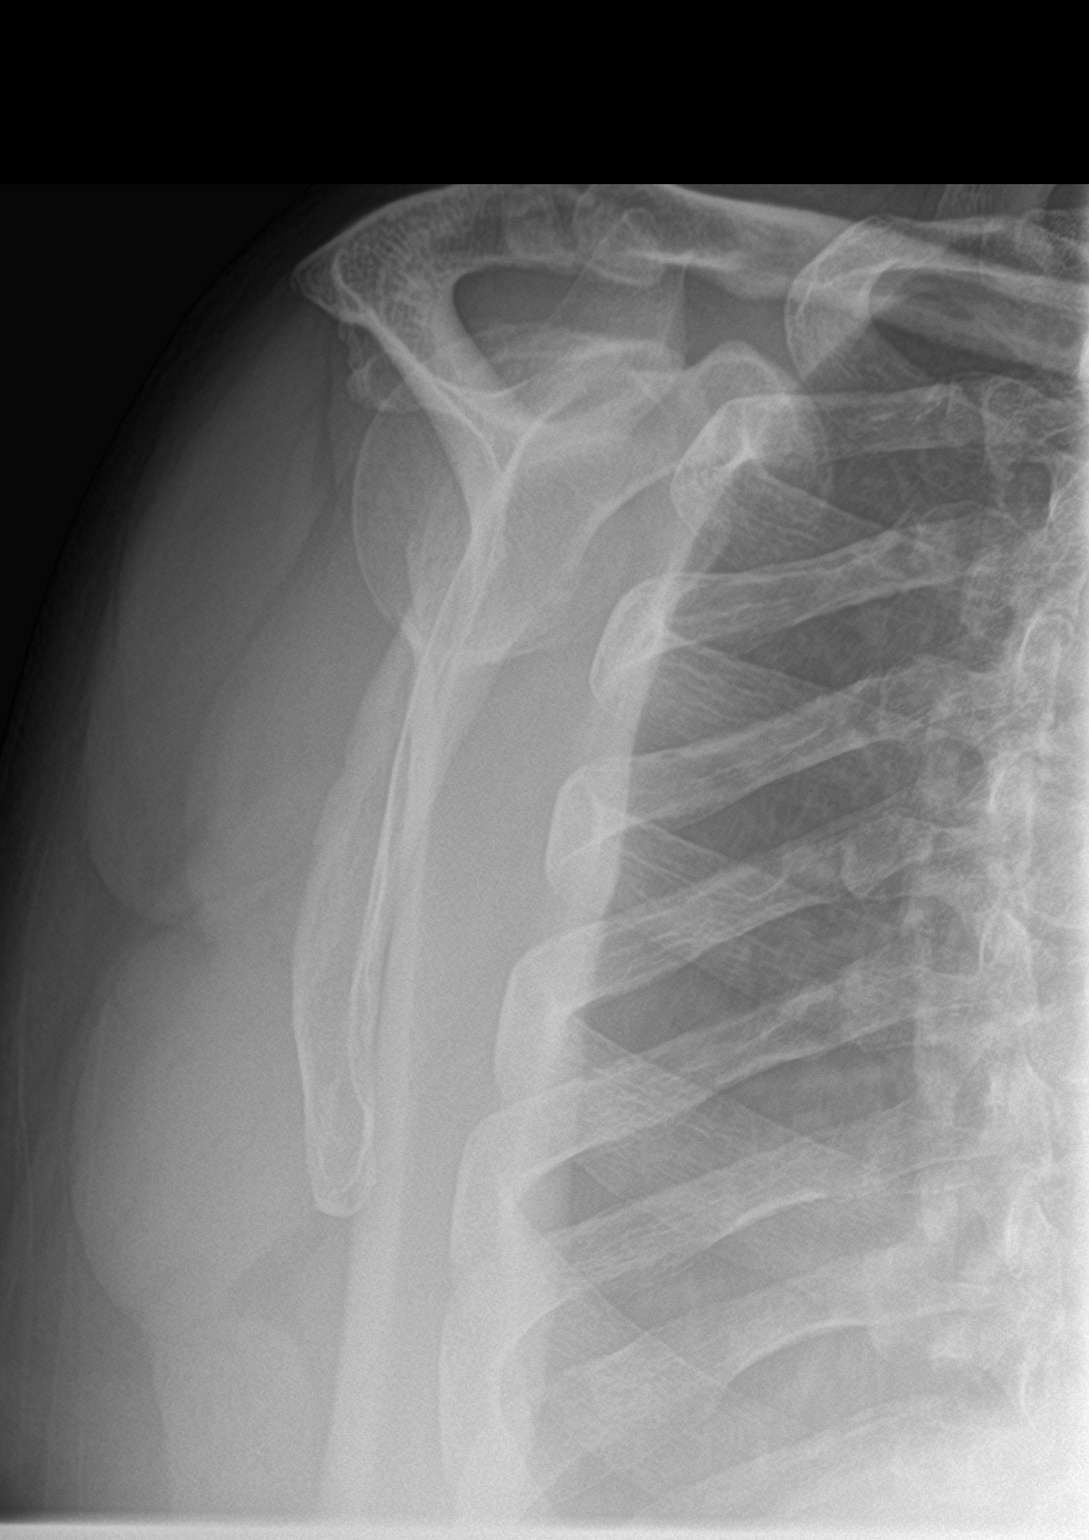
[im 3/3]
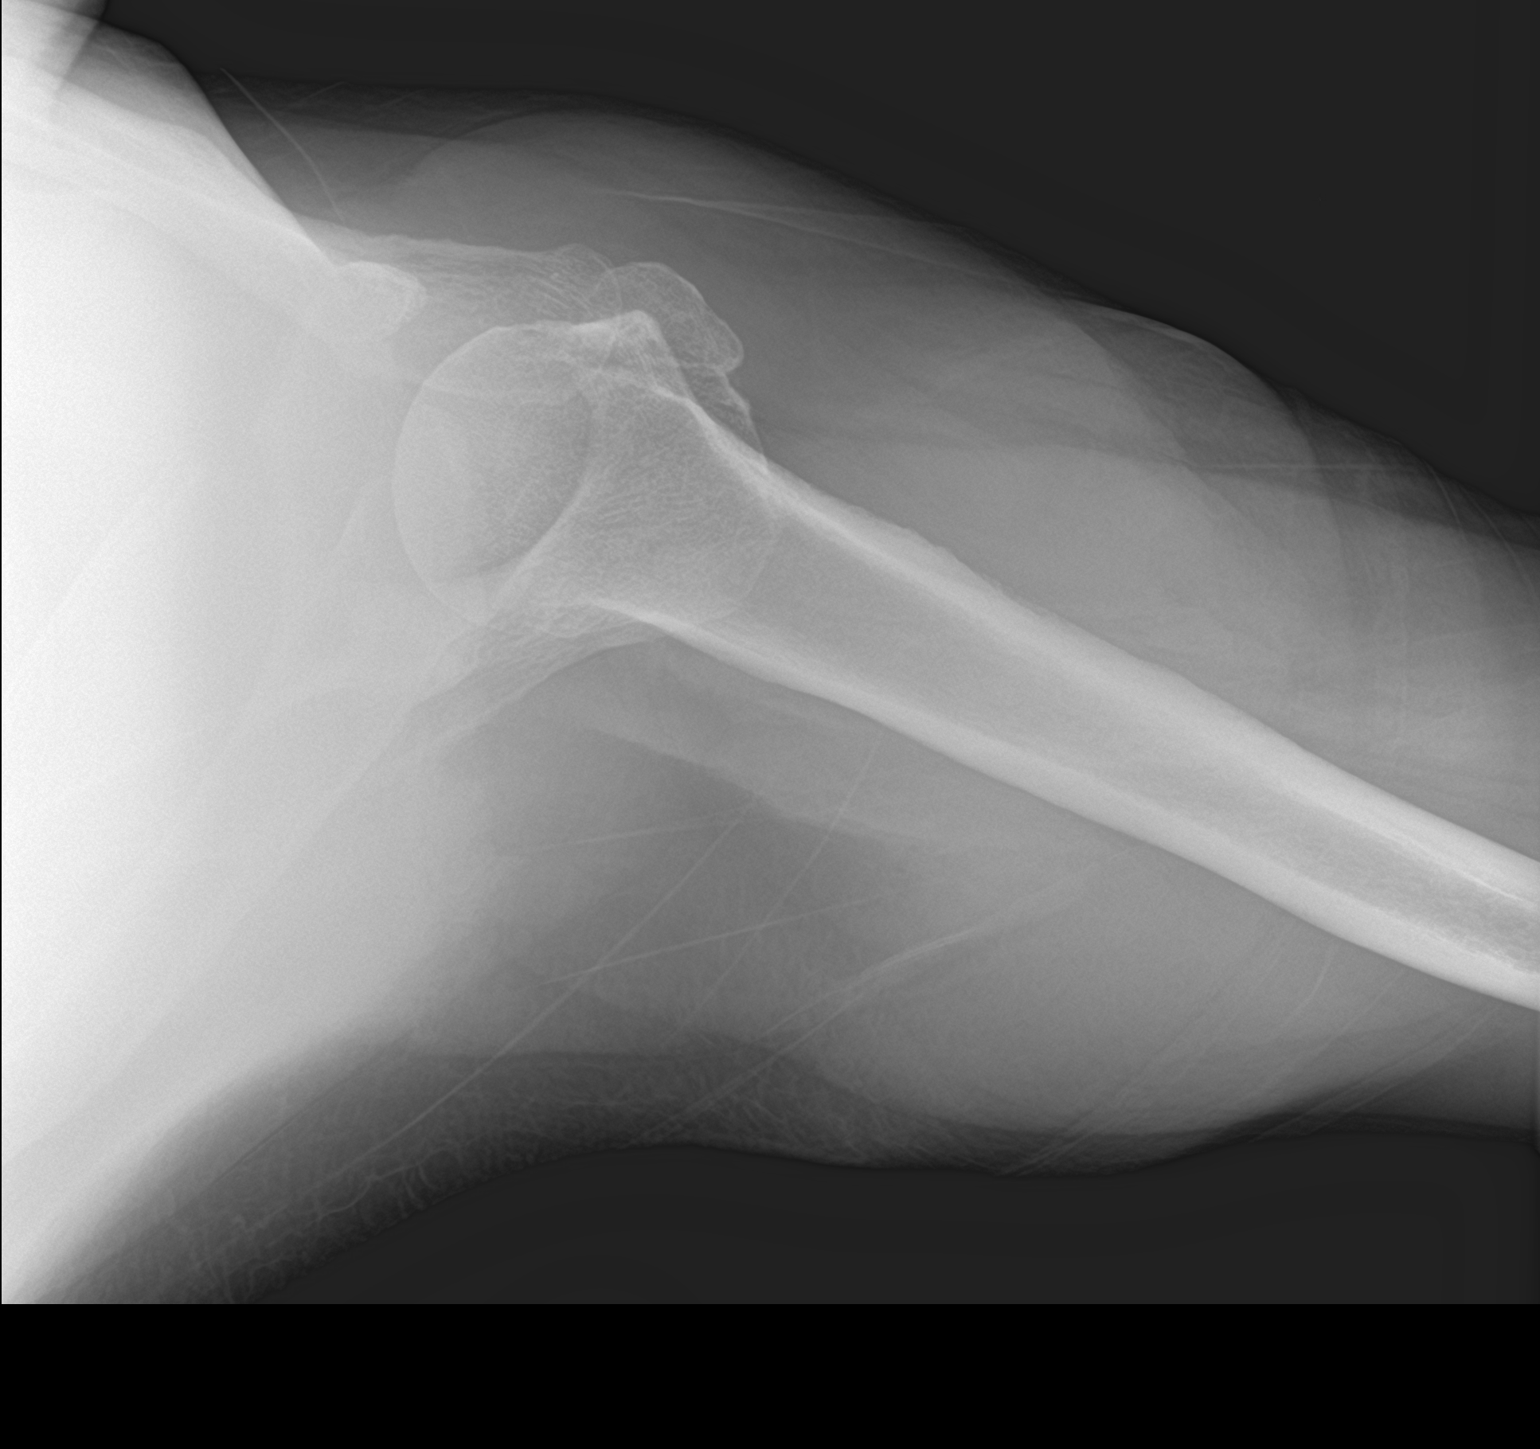

[3 of 3 positions shown; findings below may reference images not displayed]

FINDINGS: Glenohumeral joint appears normal. Normal humeral acromial distance.
Small calcification adjacent to the greater tuberosity likely within
the tendon, suggesting chronic tendinosis. Mild degenerative change
of the AC joint.
IMPRESSION: Small calcification probably within the distal rotator cuff
suggesting chronic tendinosis.

## 2017-10-28 IMAGING — US US RENAL
1 series · 14 of 25 positions shown · non-contrast
Comparison: None.

CLINICAL DATA: 61-year-old male with chronic kidney disease stage
3. Initial encounter.

EXAM:
RENAL / URINARY TRACT ULTRASOUND COMPLETE

[Series 1: us renal · 0.31mm/px · 14 of 33 slices shown]
[im 1/33]
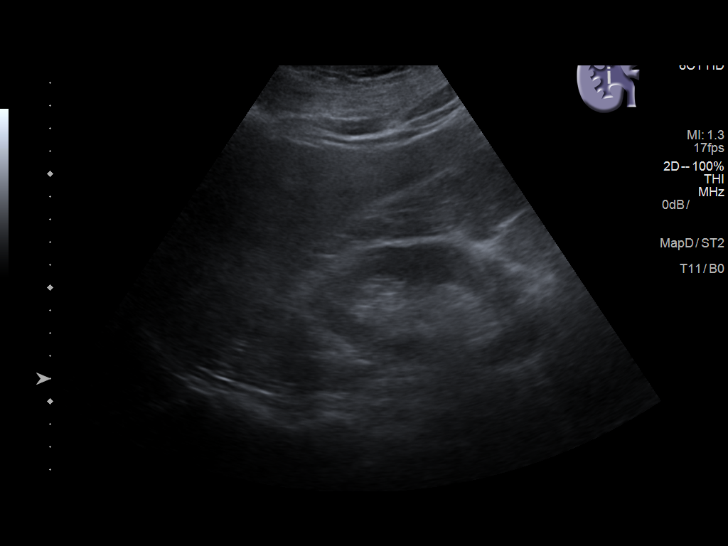
[im 3/33]
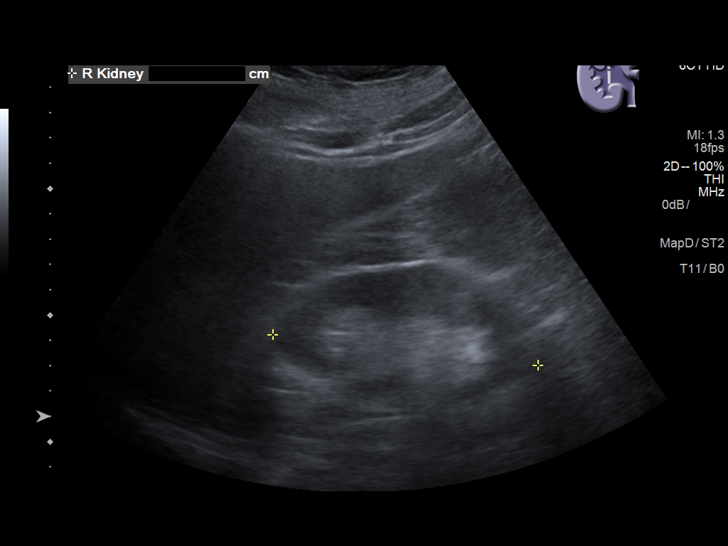
[im 6/33]
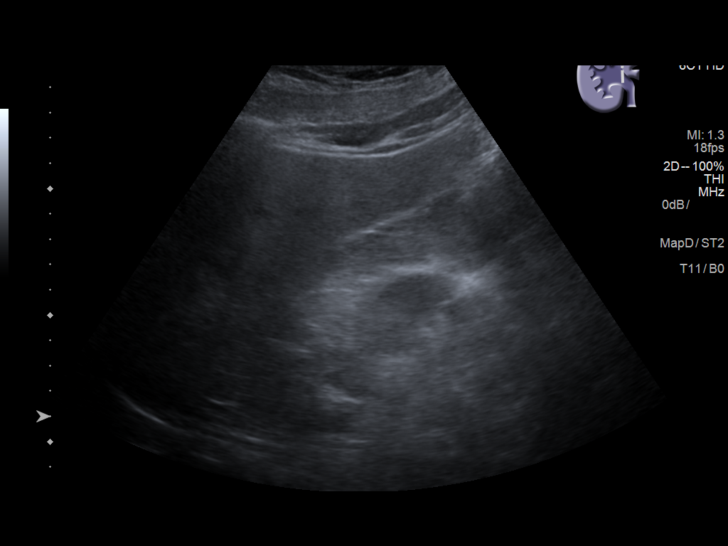
[im 9/33]
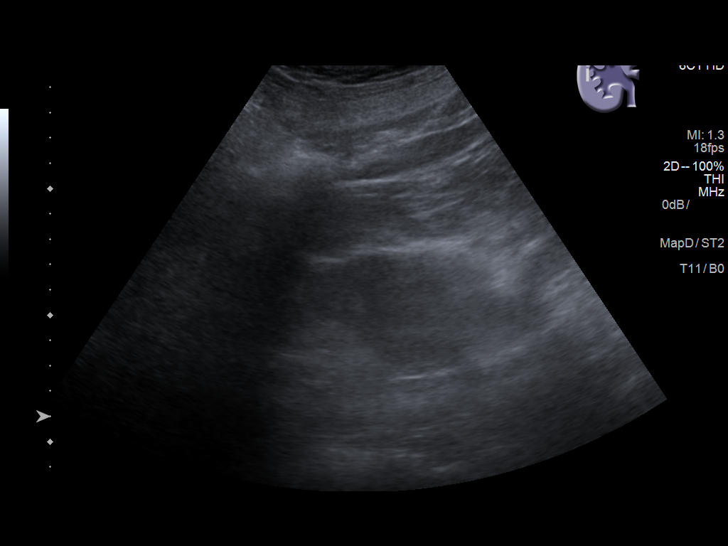
[im 11/33]
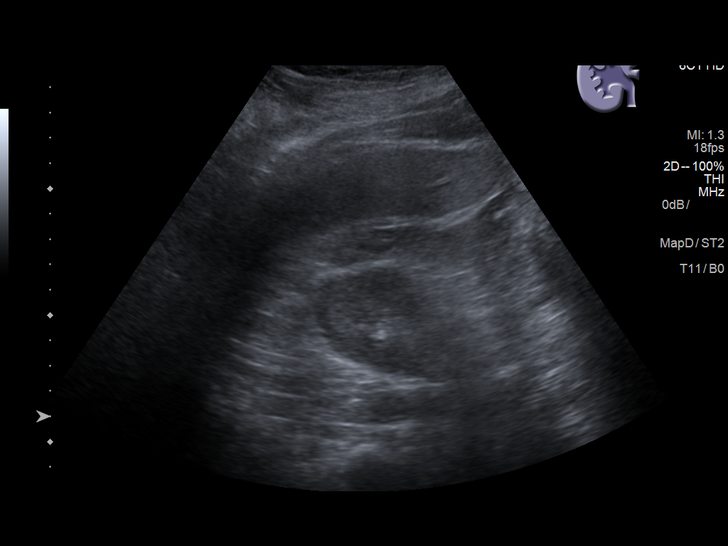
[im 13/33]
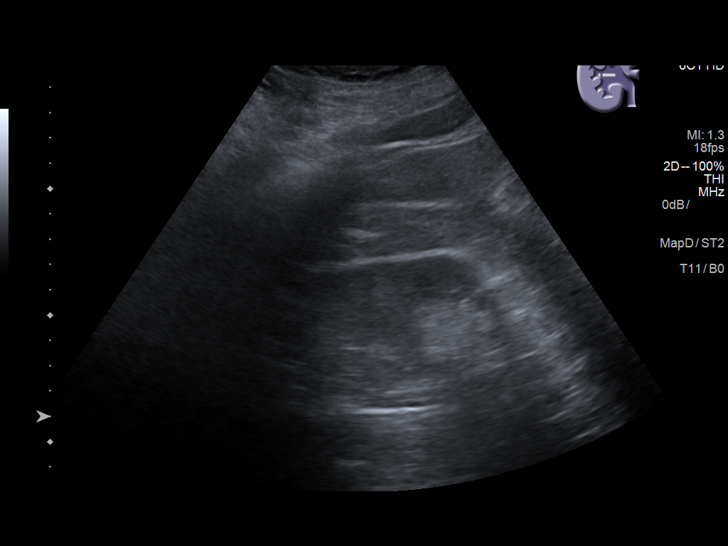
[im 15/33]
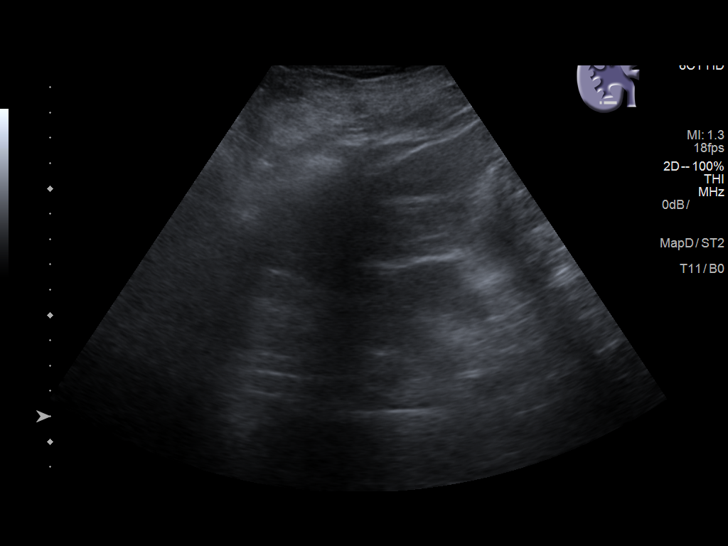
[im 18/33]
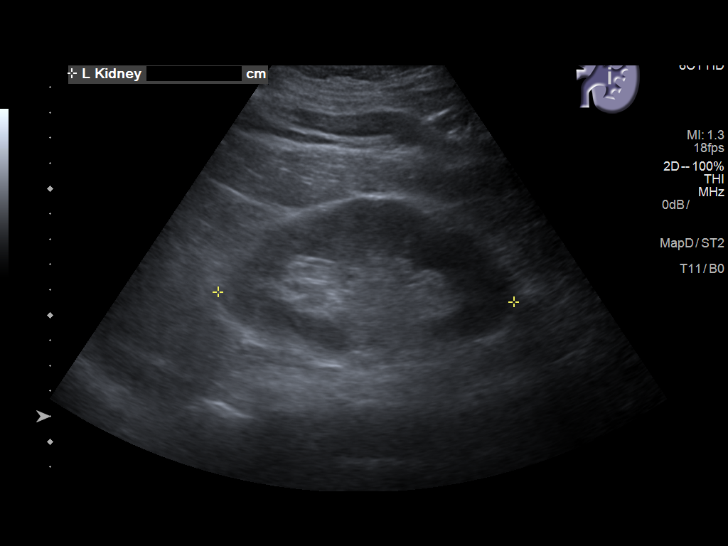
[im 21/33]
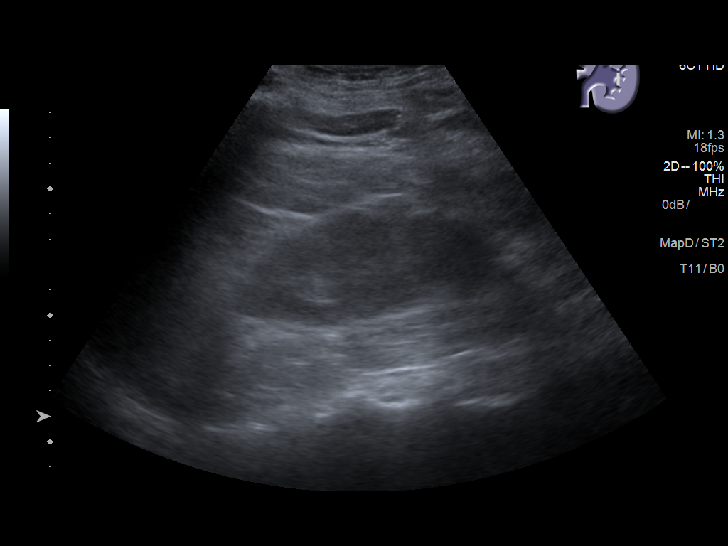
[im 22/33]
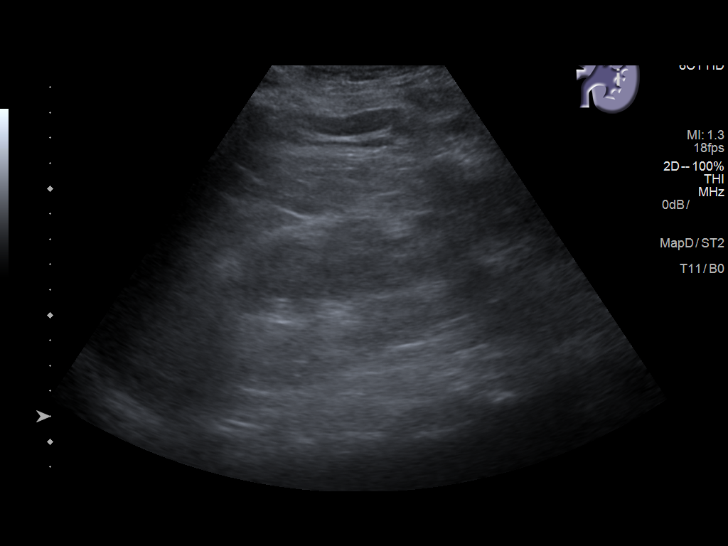
[im 25/33]
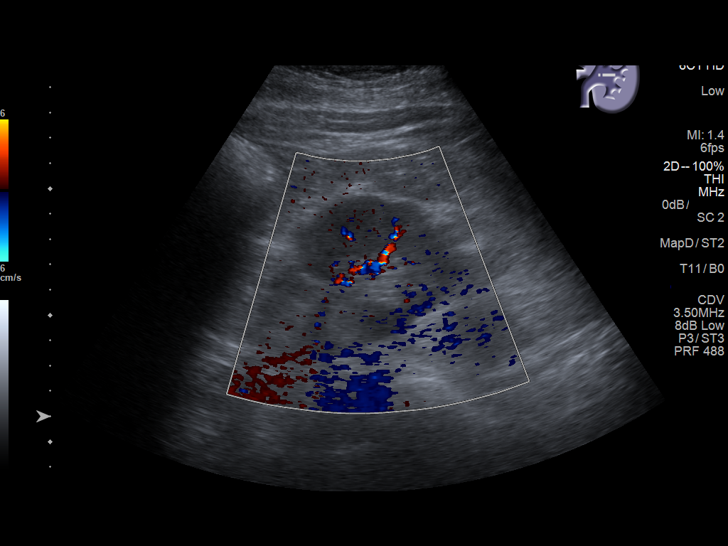
[im 27/33]
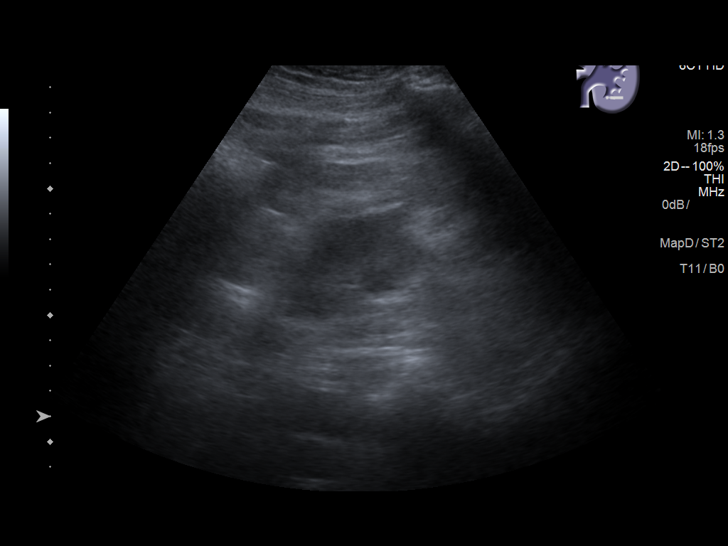
[im 30/33]
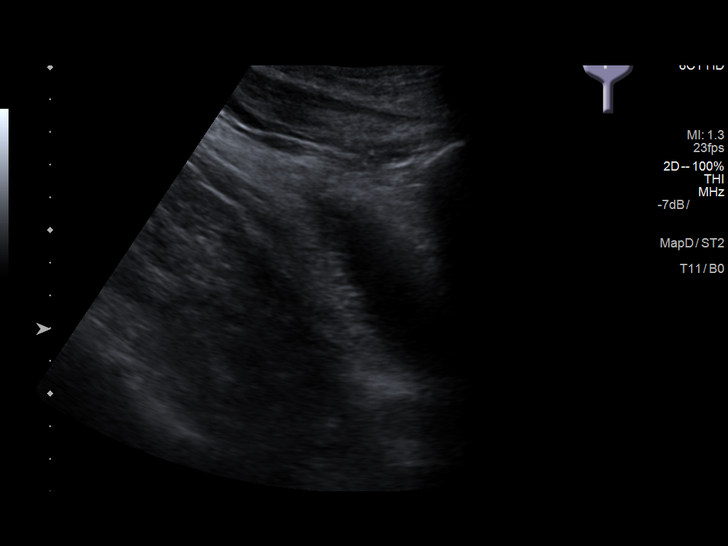
[im 33/33]
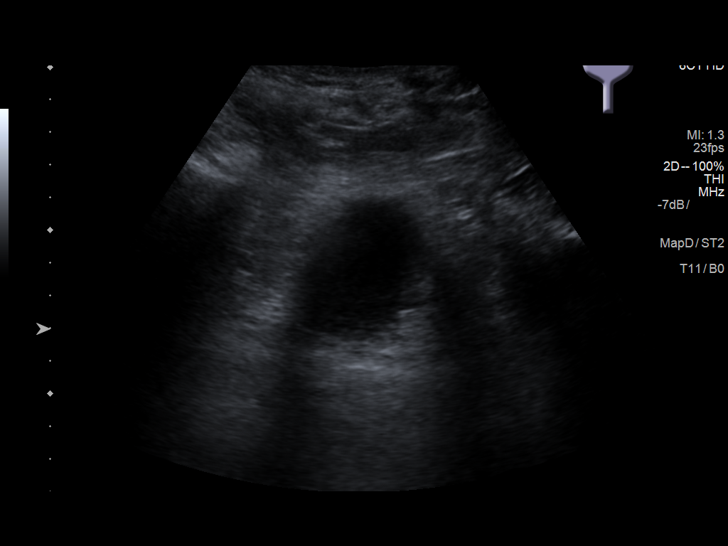

[14 of 25 positions shown; findings below may reference images not displayed]

FINDINGS: Right Kidney:

Length: 10.5 cm. Echogenicity within normal limits. No mass or
hydronephrosis visualized.

Left Kidney:

Length: 11.7 cm. Echogenicity within normal limits. No mass or
hydronephrosis visualized.

Bladder:

Incompletely distended. Appears normal for degree of bladder
distention.
IMPRESSION: Negative renal sonogram.

## 2017-12-01 ENCOUNTER — Other Ambulatory Visit
Admission: RE | Admit: 2017-12-01 | Discharge: 2017-12-01 | Disposition: A | Discharge status: Home / Self Care | Payer: BLUE CROSS/BLUE SHIELD | Source: Ambulatory Visit | Attending: Family Medicine | Admitting: Family Medicine

## 2017-12-01 DIAGNOSIS — E875 Hyperkalemia: Secondary | ICD-10-CM | POA: Insufficient documentation

## 2017-12-01 DIAGNOSIS — I10 Essential (primary) hypertension: Secondary | ICD-10-CM | POA: Insufficient documentation

## 2017-12-01 LAB — BASIC METABOLIC PANEL
Anion gap: 8 (ref 5–15)
BUN: 26 mg/dL — ABNORMAL HIGH (ref 6–20)
CO2: 27 mmol/L (ref 22–32)
Calcium: 9.1 mg/dL (ref 8.9–10.3)
Chloride: 103 mmol/L (ref 101–111)
Creatinine, Ser: 1.57 mg/dL — ABNORMAL HIGH (ref 0.61–1.24)
GFR calc Af Amer: 54 mL/min — ABNORMAL LOW (ref >60)
GFR calc non Af Amer: 46 mL/min — ABNORMAL LOW (ref >60)
Glucose, Bld: 130 mg/dL — ABNORMAL HIGH (ref 65–99)
Potassium: 5.3 mmol/L — ABNORMAL HIGH (ref 3.5–5.1)
Sodium: 138 mmol/L (ref 135–145)

## 2017-12-01 LAB — MAGNESIUM: Magnesium: 2.1 mg/dL (ref 1.7–2.4)

## 2018-01-06 ENCOUNTER — Other Ambulatory Visit: Payer: Self-pay | Admitting: Nephrology

## 2018-01-06 DIAGNOSIS — E118 Type 2 diabetes mellitus with unspecified complications: Secondary | ICD-10-CM

## 2018-01-06 DIAGNOSIS — N183 Chronic kidney disease, stage 3 unspecified: Secondary | ICD-10-CM

## 2018-01-12 ENCOUNTER — Ambulatory Visit
Admission: RE | Admit: 2018-01-12 | Discharge: 2018-01-12 | Disposition: A | Discharge status: Home / Self Care | Payer: BLUE CROSS/BLUE SHIELD | Source: Ambulatory Visit | Attending: Nephrology | Admitting: Nephrology

## 2018-01-12 DIAGNOSIS — E1122 Type 2 diabetes mellitus with diabetic chronic kidney disease: Secondary | ICD-10-CM | POA: Diagnosis not present

## 2018-01-12 DIAGNOSIS — E118 Type 2 diabetes mellitus with unspecified complications: Secondary | ICD-10-CM

## 2018-01-12 DIAGNOSIS — N183 Chronic kidney disease, stage 3 unspecified: Secondary | ICD-10-CM

## 2018-03-24 DIAGNOSIS — R801 Persistent proteinuria, unspecified: Secondary | ICD-10-CM | POA: Insufficient documentation

## 2018-10-25 ENCOUNTER — Other Ambulatory Visit: Payer: Self-pay | Admitting: Registered Nurse

## 2018-10-26 NOTE — Telephone Encounter (Signed)
[facility] staff notified patient requesting refill.  New PCM Dr Dorris Fetch seeing patients starting today.  I am no longer working in this clinic

## 2019-01-21 ENCOUNTER — Other Ambulatory Visit: Payer: Self-pay

## 2019-01-22 LAB — CMP12+LP+TP+TSH+6AC+PSA+CBC…
ALT: 17 IU/L (ref 0–44)
AST: 17 IU/L (ref 0–40)
Albumin/Globulin Ratio: 1.7 (ref 1.2–2.2)
Albumin: 4.5 g/dL (ref 3.8–4.8)
Alkaline Phosphatase: 58 IU/L (ref 39–117)
BUN/Creatinine Ratio: 20 (ref 10–24)
BUN: 25 mg/dL (ref 8–27)
Basophils Absolute: 0 10*3/uL (ref 0.0–0.2)
Basos: 0 %
Bilirubin Total: 0.3 mg/dL (ref 0.0–1.2)
Calcium: 9.2 mg/dL (ref 8.6–10.2)
Chloride: 102 mmol/L (ref 96–106)
Chol/HDL Ratio: 4.3 ratio (ref 0.0–5.0)
Cholesterol, Total: 247 mg/dL — ABNORMAL HIGH (ref 100–199)
Creatinine, Ser: 1.26 mg/dL (ref 0.76–1.27)
EOS (ABSOLUTE): 0.1 10*3/uL (ref 0.0–0.4)
Eos: 1 %
Estimated CHD Risk: 0.8 times avg. (ref 0.0–1.0)
Free Thyroxine Index: 2.1 (ref 1.2–4.9)
GFR calc Af Amer: 71 mL/min/{1.73_m2} (ref >59)
GFR calc non Af Amer: 61 mL/min/{1.73_m2} (ref >59)
GGT: 26 IU/L (ref 0–65)
Globulin, Total: 2.7 g/dL (ref 1.5–4.5)
Glucose: 132 mg/dL — ABNORMAL HIGH (ref 65–99)
HDL: 57 mg/dL (ref >39)
Hematocrit: 39.3 % (ref 37.5–51.0)
Hemoglobin: 12.8 g/dL — ABNORMAL LOW (ref 13.0–17.7)
Immature Grans (Abs): 0 10*3/uL (ref 0.0–0.1)
Immature Granulocytes: 1 %
Iron: 75 ug/dL (ref 38–169)
LDH: 153 IU/L (ref 121–224)
LDL Calculated: 172 mg/dL — ABNORMAL HIGH (ref 0–99)
Lymphocytes Absolute: 1.3 10*3/uL (ref 0.7–3.1)
Lymphs: 20 %
MCH: 27.1 pg (ref 26.6–33.0)
MCHC: 32.6 g/dL (ref 31.5–35.7)
MCV: 83 fL (ref 79–97)
Monocytes Absolute: 0.6 10*3/uL (ref 0.1–0.9)
Monocytes: 9 %
Neutrophils Absolute: 4.6 10*3/uL (ref 1.4–7.0)
Neutrophils: 69 %
Phosphorus: 3.9 mg/dL (ref 2.8–4.1)
Platelets: 241 10*3/uL (ref 150–450)
Potassium: 5 mmol/L (ref 3.5–5.2)
Prostate Specific Ag, Serum: 0.5 ng/mL (ref 0.0–4.0)
RBC: 4.72 x10E6/uL (ref 4.14–5.80)
RDW: 13.3 % (ref 11.6–15.4)
Sodium: 140 mmol/L (ref 134–144)
T3 Uptake Ratio: 26 % (ref 24–39)
T4, Total: 7.9 ug/dL (ref 4.5–12.0)
TSH: 1.17 u[IU]/mL (ref 0.450–4.500)
Total Protein: 7.2 g/dL (ref 6.0–8.5)
Triglycerides: 90 mg/dL (ref 0–149)
Uric Acid: 7.2 mg/dL (ref 3.7–8.6)
VLDL Cholesterol Cal: 18 mg/dL (ref 5–40)
WBC: 6.6 10*3/uL (ref 3.4–10.8)

## 2019-01-22 LAB — MICROALBUMIN / CREATININE URINE RATIO
Creatinine, Urine: 134.3 mg/dL
Microalb/Creat Ratio: 3 mg/g creat (ref 0–29)
Microalbumin, Urine: 3.8 ug/mL

## 2019-01-22 LAB — HGB A1C W/O EAG: Hgb A1c MFr Bld: 6.6 % — ABNORMAL HIGH (ref 4.8–5.6)

## 2019-01-22 LAB — TESTOSTERONE,FREE AND TOTAL
Testosterone, Free: 4.2 pg/mL — ABNORMAL LOW (ref 6.6–18.1)
Testosterone: 46 ng/dL — ABNORMAL LOW (ref 264–916)

## 2019-05-10 ENCOUNTER — Other Ambulatory Visit: Payer: Self-pay

## 2019-05-10 MED ORDER — CHLORTHALIDONE 25 MG PO TABS: ORAL_TABLET | ORAL | 3 refills | Status: DC

## 2019-06-08 ENCOUNTER — Other Ambulatory Visit: Payer: Self-pay

## 2019-06-08 ENCOUNTER — Ambulatory Visit: Payer: 59 | Admitting: Internal Medicine

## 2019-06-08 ENCOUNTER — Encounter: Payer: Self-pay | Admitting: Internal Medicine

## 2019-06-08 VITALS — BP 104/78 | HR 87 | Temp 98.2°F | Resp 14 | Ht 70.0 in | Wt 266.0 lb

## 2019-06-08 DIAGNOSIS — N1831 Chronic kidney disease, stage 3a: Secondary | ICD-10-CM | POA: Insufficient documentation

## 2019-06-08 DIAGNOSIS — N183 Chronic kidney disease, stage 3 unspecified: Secondary | ICD-10-CM

## 2019-06-08 DIAGNOSIS — E7849 Other hyperlipidemia: Secondary | ICD-10-CM

## 2019-06-08 DIAGNOSIS — Z6838 Body mass index (BMI) 38.0-38.9, adult: Secondary | ICD-10-CM

## 2019-06-08 DIAGNOSIS — E291 Testicular hypofunction: Secondary | ICD-10-CM | POA: Insufficient documentation

## 2019-06-08 DIAGNOSIS — I10 Essential (primary) hypertension: Secondary | ICD-10-CM

## 2019-06-08 DIAGNOSIS — E1122 Type 2 diabetes mellitus with diabetic chronic kidney disease: Secondary | ICD-10-CM

## 2019-06-08 DIAGNOSIS — Z9189 Other specified personal risk factors, not elsewhere classified: Secondary | ICD-10-CM

## 2019-06-08 NOTE — Progress Notes (Signed)
S - Patient with a h/o NIDDM who follows-up for the every 3 month check-up.  He has been followed by nephrology for CKD as well with minimal proteinuria noted and last visit with them was 04/2018 and a f/u rec'ed for 6 months after and he has not returned as of yet. He has a f/u appointment later this month with labs requested by nephrology prior to that visit  Has been feeling well with no complaints Taking the medicines for his DM and others regularly Not check blood sugars regularly presently No CP, SOB, marked fatigue or daytime somnolence when asked but noted often too tired to exercise when asked, no frequency,no numbness or tingling in the extremities, no LE swelling. Has a h/o low testosterone and was stopped about a year ago plus due to skin concerns noted when he saw dermatology. He does not get erections presently.   He does note he snores, but denied problems sleeping and marked increase in daytime tiredness. Noted his risk for sleep apnea today and testing that can be done to assess.  Not watching diet as well and weight has increased since last visit Working full time at the pool complex presently Exercise - no regular exercise regimen  No tob use  No recent eye exam, denies any vision changes of concern recent past   Current Outpatient Medications on File Prior to Visit  Medication Sig Dispense Refill  . aspirin EC 81 MG tablet Take by mouth.    Marland Kitchen atorvastatin (LIPITOR) 20 MG tablet Take by mouth.    Marland Kitchen atorvastatin (LIPITOR) 40 MG tablet TAKE 1 TABLET BY MOUTH ONCE DAILY. DISCONTINUE ATORVASTATIN 20MG.    . chlorthalidone (HYGROTON) 25 MG tablet Take 1/2 tablet by mouth every morning 45 tablet 3  . glipiZIDE (GLUCOTROL XL) 5 MG 24 hr tablet Take 5 mg by mouth daily.    Marland Kitchen lisinopril (ZESTRIL) 10 MG tablet Take by mouth.    . Saxagliptin-Metformin 2.01-999 MG TB24 Take by mouth.     No current facility-administered medications on file prior to visit.    Takes the  metformin-saxagliptin bid  .No Known Allergies   O - NAD,masked, obese  BP 104/78 (BP Location: Right Arm, Patient Position: Sitting, Cuff Size: Large)   Pulse 87   Temp 98.2 F (36.8 C) (Oral)   Resp 14   Ht _0  (1.778 m)   Wt 266 lb (120.7 kg)   SpO2 97%   BMI 38.17 kg/m   Weight 257 in June  HEENT - sclera anicteric Neck - carotids 2+ and = with no bruits,  Car - RRR without m/g/r Pulm - CTA Abd - obese, NT Ext - no LE edema Neuro - Affect not flat, approp with conversation  Grossly nonfocal  Recent Labs reviewed including A1C (6.6 in April), and LDL - 172 in April and Lipitor increased to 47m daily.   A1C tried to get in office today and unable to read [facility] will add A1C to labs obtained today per nephrology rec's   Prior labs/imaging per nephrology:  11/28/17 Cr 1.48, K 7.8, C02 20, Ca 9.1, eGFR 59 ml/min, HgbAIC 6.8  12/01/17 K 5.3, Cr 1.57, eGFR 54 ml/min, C02 27  01/06/18 urine pr:cr 0.071  01/12/18 Renal [place] kidney 10.5 cm and left kidney 11.7 kidney.   03/24/18 K 5.5, Cr 1.35, C02 25, Phos 3.5, Ca 9.5  A/P - 1. NIDDM - controlled on meds to date, unable to get A1C  in office today    Continue current medications Importance of diet modifications emphasized Importance of some regular aerobic exercise encouraged Above to help with weight control/weight loss also important  Discussed potential next steps if not better controlled and why important to keep sugars very well controlled to help protect the heart, kidneys, and lessen future complications. Eye MD f/u recommended today and importance of having annually    2. HTN- On Lisinopril 10 mg qd and CTD 12.5 mg qd. BP controlled presently  Cont meds as above  Diet and exercise importance and weight control also important  3.  Hyperlipidemia  statin to continue Importance of diet modifications and some regular aerobic exercise encouraged Above to help with weight  control/weight loss also very important  Goal is LDL < 70  4. Increased BMI/obesity - weight higher today noted  Importance of diet modifications and regular aerobic exercise emphasized to help with weight management and loss  5. CKD - stage 3A  Cont f/u with nephrology.  6. Hyperkalemia hx - Low K diet info provided to patient at initial visit with nephrology.  Check BMP today and f/u with nephrology after as planned  7. Hypogonadism - testosterone levels low on last check in April (46 and 4.2 was free testosterone), some energy decrease possibly from this as well.  He was not anxious to start testosterone supplementation again presently, and risk/benefits of this reviewed May recheck levels again with a future blood draw (when done first thing in the am) and may more seriously consider some form of testosterone supp again pending his clinical status a that time as well.   8. Risk for sleep apnea - discussed testing and risk/benefits and do think is something he should very seriously consider doing.   He noted he will me know as he wants to think about it more presently  Will check BMP, urine for microalb and A1C today and f/u after results back and has f/u with nephrology later this month as well planned. F/u here again at the latest in 3 months, sooner prn

## 2019-06-09 LAB — BASIC METABOLIC PANEL
BUN/Creatinine Ratio: 21 (ref 10–24)
BUN: 32 mg/dL — ABNORMAL HIGH (ref 8–27)
CO2: 23 mmol/L (ref 20–29)
Calcium: 9.4 mg/dL (ref 8.6–10.2)
Chloride: 101 mmol/L (ref 96–106)
Creatinine, Ser: 1.51 mg/dL — ABNORMAL HIGH (ref 0.76–1.27)
GFR calc Af Amer: 57 mL/min/{1.73_m2} — ABNORMAL LOW (ref >59)
GFR calc non Af Amer: 49 mL/min/{1.73_m2} — ABNORMAL LOW (ref >59)
Glucose: 88 mg/dL (ref 65–99)
Potassium: 5.2 mmol/L (ref 3.5–5.2)
Sodium: 140 mmol/L (ref 134–144)

## 2019-06-09 LAB — HGB A1C W/O EAG: Hgb A1c MFr Bld: 6.6 % — ABNORMAL HIGH (ref 4.8–5.6)

## 2019-06-09 LAB — MICROALBUMIN / CREATININE URINE RATIO
Creatinine, Urine: 117.4 mg/dL
Microalb/Creat Ratio: 3 mg/g creat (ref 0–29)
Microalbumin, Urine: 3.3 ug/mL

## 2019-06-09 NOTE — Progress Notes (Signed)
Please share results with patient as he does not have MyChart. His kidney function is a little worse again and to keep his f/u with nephrology later this month.  His A1C is stable at 6.6 and his urine test for microalbumin (small amounts of protein) was ok.

## 2019-06-10 NOTE — Progress Notes (Signed)
I have forwarded his labs to nephrology

## 2019-06-22 ENCOUNTER — Encounter: Payer: Self-pay | Admitting: Internal Medicine

## 2019-06-22 DIAGNOSIS — I10 Essential (primary) hypertension: Secondary | ICD-10-CM | POA: Diagnosis not present

## 2019-06-22 DIAGNOSIS — E1122 Type 2 diabetes mellitus with diabetic chronic kidney disease: Secondary | ICD-10-CM | POA: Diagnosis not present

## 2019-06-22 DIAGNOSIS — Z6838 Body mass index (BMI) 38.0-38.9, adult: Secondary | ICD-10-CM | POA: Diagnosis not present

## 2019-06-22 DIAGNOSIS — N183 Chronic kidney disease, stage 3 (moderate): Secondary | ICD-10-CM | POA: Diagnosis not present

## 2019-06-23 ENCOUNTER — Encounter: Payer: Self-pay | Admitting: Internal Medicine

## 2019-07-02 ENCOUNTER — Ambulatory Visit: Payer: Self-pay

## 2019-07-02 DIAGNOSIS — Z23 Encounter for immunization: Secondary | ICD-10-CM

## 2019-07-07 ENCOUNTER — Telehealth: Payer: Self-pay | Admitting: Registered Nurse

## 2019-07-07 ENCOUNTER — Encounter: Payer: Self-pay | Admitting: Registered Nurse

## 2019-07-07 NOTE — Telephone Encounter (Signed)
Patient pending an appointment.  Referral faxed to [facility] on 06/28/2019.  AMD

## 2019-07-07 NOTE — Telephone Encounter (Signed)
Fax received and reviewed 7 pages for nephrology consult chronic kidney disease stage G3a/A2 GFR 45-59  Patient due appt with nephrology in 6 months with BMP and urine MA:cr and provider recommended he have sleep study also.  Please verify with patient if he wants sleep study and will go to appt or schedule for appt to discuss with provider.

## 2019-07-14 ENCOUNTER — Other Ambulatory Visit: Payer: Self-pay | Admitting: Registered Nurse

## 2019-07-14 ENCOUNTER — Encounter: Payer: Self-pay | Admitting: Registered Nurse

## 2019-07-14 DIAGNOSIS — N1831 Chronic kidney disease, stage 3a: Secondary | ICD-10-CM

## 2019-07-14 DIAGNOSIS — E1122 Type 2 diabetes mellitus with diabetic chronic kidney disease: Secondary | ICD-10-CM

## 2019-07-14 NOTE — Telephone Encounter (Signed)
Preauthorization request submitted and representative stated decision would be transmitted by fax or phone in 24 hours.  Option 3 request to speak with representative to obtain formulary listing for patient insurance.  Call was disconnected when trying to obtain copy of insurance formulary. Will try again during business hours on another clinic day.

## 2019-07-14 NOTE — Telephone Encounter (Signed)
Ernest [name] [phone]) of Finance Dept. Said they don't have a copy of [employer] drug formulary.  She advised to call [employer] at [phone].  AMD

## 2019-07-14 NOTE — Telephone Encounter (Signed)
Received message from pharmacist kombiglyze XR 2.01/999 not on patient insurance formulary.  I need patient plan information so I can search their formulary/options for patient.  Patient well controlled on [place] since 2017 last HgbA1c 6.6 on 06/08/2019.

## 2019-07-21 ENCOUNTER — Telehealth: Payer: Self-pay

## 2019-07-21 ENCOUNTER — Encounter: Payer: Self-pay | Admitting: Registered Nurse

## 2019-07-21 MED ORDER — JANUMET XR 50-1000 MG PO TB24: 2.0000 | ORAL_TABLET | Freq: Every day | ORAL | 0 refills | Status: DC

## 2019-07-21 NOTE — Telephone Encounter (Signed)
Formulary for new insurance is Janumet XR will have patient start new medication and follow up in 3 months for HgbA1c, BMET for renal function.  Epocrates recommends CBC in 12 months and Vitamin B12 every 2-3 years and since patient was on [place] since 2017 due to B12 level with next lab draw also.  Please notify patient medication is the same class as the kombiglyze (metformin plus DPP-4 inhibitor).  Please verify if patient has glucometer at home and if he is having symptoms hypo/hyperglycermia he can check with his home meter.  Please notify us if he is having any side effects.

## 2019-07-21 NOTE — Telephone Encounter (Signed)
Informed Coralyn Mark that [employer] on [employer]'s drug formulary & they recommended switching to [place].  Informed him Gerarda Fraction, NP-C has been working on this for him & that she's made the switch for him & submitted electronic prescription for the new medication. Verbalized understanding.  AMD

## 2019-07-21 NOTE — Telephone Encounter (Signed)
Received notification from [facility] that Ernest [name] is scheduled for a sleep study on 08/03/2019.  AMD

## 2019-07-21 NOTE — Addendum Note (Signed)
Addended by: Gerarda Fraction A on: 07/21/2019 12:12 PM   Modules accepted: Orders

## 2019-08-18 ENCOUNTER — Other Ambulatory Visit: Payer: Self-pay

## 2019-08-18 DIAGNOSIS — I10 Essential (primary) hypertension: Secondary | ICD-10-CM

## 2019-08-18 MED ORDER — LISINOPRIL 10 MG PO TABS: 10.0000 mg | ORAL_TABLET | Freq: Every day | ORAL | 1 refills | Status: DC

## 2019-08-18 NOTE — Telephone Encounter (Signed)
Saw nephrology 06/22/2019 BP well controlled continue lisinopril.  Last visit with Dr Roxan Hockey 06/08/2019 and labs performed.  Electronic Rx sent to his pharmacy of choice for 90 day supply and 1 refill.

## 2019-08-25 ENCOUNTER — Other Ambulatory Visit: Payer: Self-pay

## 2019-08-25 DIAGNOSIS — I10 Essential (primary) hypertension: Secondary | ICD-10-CM

## 2019-08-25 MED ORDER — LISINOPRIL 10 MG PO TABS: 10.0000 mg | ORAL_TABLET | Freq: Every day | ORAL | 1 refills | Status: DC

## 2019-08-25 NOTE — Telephone Encounter (Signed)
Labs 01/21/2019 per outpatient paper record.  06/08/2019 epic BMET CR and BUN increased.    Labs [facility] 11/10/2018 BUN/Cr increased.  Nephrology consult completed Sep 2020 due follow up 6 months.  GFR 45-59 (greater than 30 no dose adjustment required on lisinopril)  Refill entered for 6 months supply lisinopril 10mg  po daily #90 RF1

## 2019-08-30 DIAGNOSIS — Z20828 Contact with and (suspected) exposure to other viral communicable diseases: Secondary | ICD-10-CM | POA: Diagnosis not present

## 2019-09-01 ENCOUNTER — Other Ambulatory Visit: Payer: Self-pay

## 2019-09-06 DIAGNOSIS — R05 Cough: Secondary | ICD-10-CM | POA: Diagnosis not present

## 2019-09-06 DIAGNOSIS — Z20828 Contact with and (suspected) exposure to other viral communicable diseases: Secondary | ICD-10-CM | POA: Diagnosis not present

## 2019-09-07 ENCOUNTER — Ambulatory Visit: Payer: 59

## 2019-10-06 DIAGNOSIS — M9901 Segmental and somatic dysfunction of cervical region: Secondary | ICD-10-CM | POA: Diagnosis not present

## 2019-10-06 DIAGNOSIS — M9902 Segmental and somatic dysfunction of thoracic region: Secondary | ICD-10-CM | POA: Diagnosis not present

## 2019-10-21 ENCOUNTER — Other Ambulatory Visit: Payer: 59

## 2019-10-21 ENCOUNTER — Other Ambulatory Visit: Payer: Self-pay

## 2019-10-21 DIAGNOSIS — I10 Essential (primary) hypertension: Secondary | ICD-10-CM

## 2019-10-21 DIAGNOSIS — E291 Testicular hypofunction: Secondary | ICD-10-CM

## 2019-10-21 DIAGNOSIS — E538 Deficiency of other specified B group vitamins: Secondary | ICD-10-CM

## 2019-10-21 DIAGNOSIS — N183 Chronic kidney disease, stage 3 unspecified: Secondary | ICD-10-CM

## 2019-10-21 DIAGNOSIS — E1122 Type 2 diabetes mellitus with diabetic chronic kidney disease: Secondary | ICD-10-CM

## 2019-10-21 NOTE — Progress Notes (Signed)
Patient comes in today for follow up labs.

## 2019-10-21 NOTE — Addendum Note (Signed)
Addended by: Nida Boatman on: 10/21/2019 09:19 AM   Modules accepted: Orders

## 2019-10-24 LAB — BASIC METABOLIC PANEL
BUN/Creatinine Ratio: 17 (ref 10–24)
BUN: 28 mg/dL — ABNORMAL HIGH (ref 8–27)
CO2: 22 mmol/L (ref 20–29)
Calcium: 9.1 mg/dL (ref 8.6–10.2)
Chloride: 101 mmol/L (ref 96–106)
Creatinine, Ser: 1.62 mg/dL — ABNORMAL HIGH (ref 0.76–1.27)
GFR calc Af Amer: 52 mL/min/{1.73_m2} — ABNORMAL LOW (ref >59)
GFR calc non Af Amer: 45 mL/min/{1.73_m2} — ABNORMAL LOW (ref >59)
Glucose: 152 mg/dL — ABNORMAL HIGH (ref 65–99)
Potassium: 5.3 mmol/L — ABNORMAL HIGH (ref 3.5–5.2)
Sodium: 137 mmol/L (ref 134–144)

## 2019-10-24 LAB — TESTOSTERONE,FREE AND TOTAL
Testosterone, Free: 4.2 pg/mL — ABNORMAL LOW (ref 6.6–18.1)
Testosterone: 46 ng/dL — ABNORMAL LOW (ref 264–916)

## 2019-10-24 LAB — HGB A1C W/O EAG: Hgb A1c MFr Bld: 6.7 % — ABNORMAL HIGH (ref 4.8–5.6)

## 2019-10-24 LAB — VITAMIN B12: Vitamin B-12: 555 pg/mL (ref 232–1245)

## 2019-10-24 NOTE — Progress Notes (Signed)
Please ask patient if he wants printed copy of labs.  Last office visit 06/08/2019 with Dr Dorris Fetch. Last nephrology consult Sep 2020 for Chronic kidney disease (CKD) stage G3a/A2, moderately decreased glomerular filtration rate (GFR) between 45-59 mL/min/1.73 square meter and albuminuria creatinine ratio between 30-299 mg/g (CMS-HCC) (Primary Dx) and he is supposed to follow up with nephrology in 6 months Mar 2021.  Please send copy of his labs to his nephrology provider ([facility] Dr Austin Miles) after patient approves.  Scheduled for appt 10/27/2019 with [facility] provider PA Katrinka Blazing.   Worsening creatinine and GFR improved BUN.  Potassium slightly elevated please check with patient if he is following his low potassium diet per nephrology  Increase water intake to keep urine clear pale yellow and urinating every 2-3 hours while awake.  B12 normal.  HgbA1c slightly worsened 6.7 compared to 6.6.  Testosterone low stable 46.  Repeat renal labs per nephrology recommendations and next HgbA1c in 3 months.  I recommend avoid dehydration and weight gain, exercise 150 minutes per week; dietary fiber 30 grams per day men; eat whole grains/fruits/vegetables; keep added sugars to less than 150 calories/9 teaspoons for men per American Heart Association. My chart message sent to patient "Ernest [name], Please see Jess or Dewayne Hatch if you would like paper copy of lab results printed for you. Have you scheduled your follow up with nephrology in 6 months Mar 2021 (last appt September 22nd).  Can we send a copy of these labs to your nephrology provider ([facility] Austin Miles)?  I see you are scheduled for appt 10/27/2019 with [facility] provider PA Katrinka Blazing.   Your creatinine and GFR (kidney function labs) worsened but improved BUN (another kidney function lab).  Potassium slightly elevated again.  Are you following the low potassium diet per nephrology?  Increase water intake to keep urine clear pale yellow and urinating every 2-3 hours while awake.  B12 normal.  HgbA1c  slightly worsened 6.7 compared to 6.6.  Testosterone low stable 46.  Repeat kidney function labs per nephrology recommendations and next HgbA1c in 3 months.  I recommend weight loss, exercise 150 minutes per week; dietary fiber 30 grams per day men; eat whole grains/fruits/vegetables; keep added sugars to less than 150 calories/9 teaspoons for men per American Heart Association.  Consider weight loss BMI at last check 38 on 22 Jun 2019.   Please let me know if you have further questions. Sincerely, Albina Billet NP-C"

## 2019-10-25 ENCOUNTER — Telehealth: Payer: Self-pay

## 2019-10-25 NOTE — Telephone Encounter (Signed)
-----   Message from Barbaraann Barthel, NP sent at 10/24/2019 12:05 PM EST ----- Please ask patient if he wants printed copy of labs.  Last office visit 06/08/2019 with Dr Dorris Fetch. Last nephrology consult Sep 2020 for Chronic kidney disease (CKD) stage G3a/A2, moderately decreased glomerular filtration rate (GFR) between 45-59 mL/min/1.73 square meter and albuminuria creatinine ratio between 30-299 mg/g (CMS-HCC) (Primary Dx) and he is supposed to follow up with nephrology in 6 months Mar 2021.  Please send copy of his labs to his nephrology provider ([facility] Austin Miles) after patient approves.  Scheduled for appt 10/27/2019 with [facility] provider PA Katrinka Blazing.   Worsening creatinine and GFR improved BUN.  Potassium slightly elevated please check with patient if he is following his low potassium diet per nephrology  Increase water intake to keep urine clear pale yellow and urinating every 2-3 hours while awake.  B12 normal.  HgbA1c slightly worsened 6.7 compared to 6.6.  Testosterone low stable 46.  Repeat renal labs per nephrology recommendations and next HgbA1c in 3 months.  I recommend avoid dehydration and weight gain, exercise 150 minutes per week; dietary fiber 30 grams per day men; eat whole grains/fruits/vegetables; keep added sugars to less than 150 calories/9 teaspoons for men per American Heart Association. My chart message sent to patient "Ernest [name], Please see Jess or Dewayne Hatch if you would like paper copy of lab results printed for you. Have you scheduled your follow up with nephrology in 6 months Mar 2021 (last appt September 22nd).  Can we send a copy of these labs to your nephrology provider ([facility] Austin Miles)?  I see you are scheduled for appt 10/27/2019 with [facility] provider PA Katrinka Blazing.   Your creatinine and GFR (kidney function labs) worsened but improved BUN (another kidney function lab).  Potassium slightly elevated again.  Are you following the low potassium diet per nephrology?  Increase water intake to keep urine clear  pale yellow and urinating every 2-3 hours while awake.  B12 normal.  HgbA1c slightly worsened 6.7 compared to 6.6.  Testosterone low stable 46.  Repeat kidney function labs per nephrology recommendations and next HgbA1c in 3 months.  I recommend weight loss, exercise 150 minutes per week; dietary fiber 30 grams per day men; eat whole grains/fruits/vegetables; keep added sugars to less than 150 calories/9 teaspoons for men per American Heart Association.  Consider weight loss BMI at last check 38 on 22 Jun 2019.   Please let me know if you have further questions. Sincerely, Albina Billet NP-C"

## 2019-10-25 NOTE — Telephone Encounter (Signed)
Contacted [employer] to send lab results to Dr. Austin Miles. States he hasn't been following a low potassium diet, but cut back on sodium in his diet.  Encouraged to increase water intake as recommended by Ilean Skill, NP-C.    Has some questions about cholesterol medication & his testosterone results.  Will discuss with Arlyss Queen PA-C at 10/27/19 appointment.  AMD

## 2019-10-25 NOTE — Telephone Encounter (Signed)
Noted will need to print list of high potassium foods to eat sparingly for patient when he comes to appt along with symptoms of elevated potassium from [facility] so he can notify us if these symptoms occurring and recheck potassium if symptoms occur.

## 2019-10-27 ENCOUNTER — Ambulatory Visit: Payer: 59 | Admitting: Physician Assistant

## 2019-10-27 ENCOUNTER — Other Ambulatory Visit: Payer: Self-pay

## 2019-10-27 VITALS — BP 114/77 | HR 110 | Temp 99.3°F | Ht 70.0 in | Wt 277.8 lb

## 2019-10-27 DIAGNOSIS — E291 Testicular hypofunction: Secondary | ICD-10-CM

## 2019-10-27 DIAGNOSIS — N1831 Chronic kidney disease, stage 3a: Secondary | ICD-10-CM

## 2019-10-27 DIAGNOSIS — E1122 Type 2 diabetes mellitus with diabetic chronic kidney disease: Secondary | ICD-10-CM

## 2019-10-27 DIAGNOSIS — N183 Chronic kidney disease, stage 3 unspecified: Secondary | ICD-10-CM

## 2019-10-27 NOTE — Progress Notes (Signed)
   Subjective: Follow-up lab results    Patient ID: Ernest [name], male    DOB: 06-May-1956, 65 y.o.   MRN: [id number]  HPI Patient presents today for 75-month follow-up of lab results secondary to diabetes, chronic kidney disease, and low testosterone.  Patient states testosterone injection discontinued by prior PCP 10 months ago.  Patient states tolerating medicines well but concerned about increasing fatigue and decreased libido.  Patient has an appointment in 2 months with his nephrologist.   Review of Systems Diabetes, hyperlipidemia, and hypertension.     Objective:   Physical Exam Deferred       Assessment & Plan:  Discussed with patient hemoglobin A1c which has increased from 6.6-6.7.  Patient's potassium is increased from 5.2-5.3.  Patient GFR has has worsened.  BUN has improved.  Advised patient to continue previous medications.  Advised to follow-up with scheduled nephrology appointment.  Will consult urology for evaluation to restart testosterone.

## 2019-10-29 NOTE — Progress Notes (Signed)
Patient seen in clinic 10/27/2019 and discussed lab results with PA Katrinka Blazing.

## 2019-11-04 ENCOUNTER — Other Ambulatory Visit: Payer: 59

## 2019-11-04 ENCOUNTER — Other Ambulatory Visit: Payer: Self-pay

## 2019-11-04 DIAGNOSIS — E7849 Other hyperlipidemia: Secondary | ICD-10-CM

## 2019-11-04 NOTE — Progress Notes (Signed)
Patient comes in today for labs. 

## 2019-11-05 LAB — LIPID PANEL
Chol/HDL Ratio: 3 ratio (ref 0.0–5.0)
Cholesterol, Total: 160 mg/dL (ref 100–199)
HDL: 53 mg/dL (ref >39)
LDL Chol Calc (NIH): 93 mg/dL (ref 0–99)
Triglycerides: 72 mg/dL (ref 0–149)
VLDL Cholesterol Cal: 14 mg/dL (ref 5–40)

## 2019-12-03 ENCOUNTER — Encounter: Payer: Self-pay | Admitting: Urology

## 2019-12-03 ENCOUNTER — Other Ambulatory Visit: Payer: Self-pay

## 2019-12-03 ENCOUNTER — Ambulatory Visit (INDEPENDENT_AMBULATORY_CARE_PROVIDER_SITE_OTHER): Payer: 59 | Admitting: Urology

## 2019-12-03 VITALS — BP 144/89 | HR 93 | Ht 71.0 in | Wt 270.0 lb

## 2019-12-03 DIAGNOSIS — E291 Testicular hypofunction: Secondary | ICD-10-CM | POA: Diagnosis not present

## 2019-12-03 NOTE — Progress Notes (Signed)
12/03/2019 8:27 AM   Ernest [name] March 08, 1956 [id number]  Referring provider: Sable Feil, PA-C [address] [place],  [place] [address]  Chief Complaint  Patient presents with  . Other    HPI: Ernest [name] is a 64 y.o. male seen in consultation at the request of Mardee Postin, PA-C for evaluation of hypogonadism.  -Previously on TRT for 2-3 years -States new city physician [place] 1.5-2 years ago -Complains of significant loss of energy, tiredness, fatigue -decreased libido and mild ED -Total/free T levels 4/20 and 1/21 were 46/4.2 -No recent PSA -No LH/prolactin in epic -Denies bothersome LUTS, dysuria, gross hematuria   PMH: Past Medical History:  Diagnosis Date  . CKD (chronic kidney disease)   . Colon polyps   . Diabetes mellitus without complication ([facility])   . Elevated lipids   . Flat feet   . Gout   . Hyperkalemia   . Hypertension   . Over weight     Surgical History: Past Surgical History:  Procedure Laterality Date  . COLONOSCOPY      Home Medications:  Allergies as of 12/03/2019   No Known Allergies     Medication List       Accurate as of December 03, 2019  8:27 AM. If you have any questions, ask your nurse or doctor.        aspirin EC 81 MG tablet Take by mouth.   atorvastatin 20 MG tablet Commonly known as: LIPITOR Take by mouth.   atorvastatin 40 MG tablet Commonly known as: LIPITOR TAKE 1 TABLET BY MOUTH ONCE DAILY. DISCONTINUE ATORVASTATIN 20MG .   chlorthalidone 25 MG tablet Commonly known as: HYGROTON Take 1/2 tablet by mouth every morning   CoQ10 30 MG Caps Take 30 mg by mouth 3 (three) times daily.   glipiZIDE 5 MG 24 hr tablet Commonly known as: GLUCOTROL XL Take 5 mg by mouth daily.   Janumet XR 50-1000 MG Tb24 Generic drug: SitaGLIPtin-MetFORMIN HCl Take 2 tablets by mouth daily.   lisinopril 10 MG tablet Commonly known as: ZESTRIL Take 1 tablet (10 mg total) by mouth daily.       Allergies: No Known  Allergies  Family History: Family History  Problem Relation Age of Onset  . Diabetes Mother   . Diabetes Paternal Grandmother   . Hypertension Paternal Grandmother   . Diabetes Brother   . Blindness Brother        due to DM    Social History:  reports that he has never smoked. He has never used smokeless tobacco. No history on file for alcohol and drug.   Physical Exam: BP (!) 144/89 (BP Location: Left Arm, Patient Position: Sitting, Cuff Size: Large)   Pulse 93   Ht 5\' 11"  (1.803 m)   Wt 270 lb (122.5 kg)   BMI 37.66 kg/m   Constitutional:  Alert and oriented, No acute distress. HEENT: [place] AT, moist mucus membranes.  Trachea midline, no masses. Cardiovascular: No clubbing, cyanosis, or edema. Respiratory: Normal respiratory effort, no increased work of breathing. GI: Abdomen is soft, nontender, nondistended, no abdominal masses GU: Phallus without lesions, testes descended bilaterally without masses or tenderness.  Estimated volume R 15 cc/L 20 cc.  Prostate 30 g, smooth without nodules Skin: No rashes, bruises or suspicious lesions. Neurologic: Grossly intact, no focal deficits, moving all 4 extremities. Psychiatric: Normal mood and affect.  Laboratory Data:  Lab Results  Component Value Date   TESTOSTERONE 46 (L) 10/21/2019  Assessment & Plan:    - Hypogonadism Total testosterone at castrate levels.  Will check LH/prolactin, PSA and if no significant abnormalities will restart TRT with testosterone cypionate 200 mg every 2 weeks.  Potential side effects of testosterone replacement were discussed including stimulation of benign prostatic growth with lower urinary tract symptoms; erythrocytosis; edema; gynecomastia; worsening sleep apnea; venous thromboembolism; testicular atrophy and infertility. Recent studies suggesting an increased incidence of heart attack and stroke in patients taking testosterone was discussed. He was informed there is conflicting evidence  regarding the impact of testosterone therapy on cardiovascular risk. The theoretical risk of growth stimulation of an undetected prostate cancer was also discussed.  He was informed that current evidence does not provide any definitive answers regarding the risks of testosterone therapy on prostate cancer and cardiovascular disease. The need for periodic monitoring of his testosterone level, PSA, hematocrit and DRE was discussed.   Riki Altes, MD  [facility] [address] [place], [place] [address] [phone]

## 2019-12-04 ENCOUNTER — Other Ambulatory Visit: Payer: Self-pay | Admitting: Urology

## 2019-12-04 LAB — PSA: Prostate Specific Ag, Serum: 0.3 ng/mL (ref 0.0–4.0)

## 2019-12-04 LAB — LUTEINIZING HORMONE: LH: 16.3 m[IU]/mL — ABNORMAL HIGH (ref 1.7–8.6)

## 2019-12-04 LAB — PROLACTIN: Prolactin: 8.3 ng/mL (ref 4.0–15.2)

## 2019-12-04 MED ORDER — TESTOSTERONE CYPIONATE 200 MG/ML IM SOLN: 200.0000 mg | INTRAMUSCULAR | 0 refills | Status: DC

## 2019-12-06 ENCOUNTER — Telehealth: Payer: Self-pay | Admitting: *Deleted

## 2019-12-06 NOTE — Telephone Encounter (Signed)
Talk with patient, the nurse will be giving him his injection . He will get the testosterone done in 5 week and fax a copy to [place].

## 2019-12-06 NOTE — Telephone Encounter (Signed)
-----   Message from Riki Altes, MD sent at 12/04/2019 12:17 PM EST ----- Additional blood work did not reveal any pituitary abnormalities.  Testosterone Rx sent to pharmacy.  He was getting injections from the city nurses.  Can schedule an appointment for injection training if he desires.  Needs a follow-up visit with testosterone level 4-6 weeks after starting injections

## 2019-12-09 ENCOUNTER — Other Ambulatory Visit: Payer: Self-pay | Admitting: Registered Nurse

## 2019-12-09 DIAGNOSIS — N1831 Chronic kidney disease, stage 3a: Secondary | ICD-10-CM

## 2019-12-09 NOTE — Telephone Encounter (Signed)
[facility] pt. Thanks!

## 2019-12-10 ENCOUNTER — Ambulatory Visit: Payer: 59

## 2019-12-10 ENCOUNTER — Other Ambulatory Visit: Payer: Self-pay

## 2019-12-10 DIAGNOSIS — E291 Testicular hypofunction: Secondary | ICD-10-CM

## 2019-12-10 MED ORDER — TESTOSTERONE CYPIONATE 200 MG/ML IM SOLN
200.0000 mg | INTRAMUSCULAR | Status: AC
  Administered 2019-12-10 – 2020-05-12 (×12): 200 mg via INTRAMUSCULAR

## 2019-12-10 NOTE — Progress Notes (Signed)
Presents for testosterone injection administration.    Assessment & Plan:     - Hypogonadism Total testosterone at castrate levels.  Will check LH/prolactin, PSA and if no significant abnormalities will restart TRT with testosterone cypionate 200 mg every 2 weeks.   Potential side effects of testosterone replacement were discussed including stimulation of benign prostatic growth with lower urinary tract symptoms; erythrocytosis; edema; gynecomastia; worsening sleep apnea; venous thromboembolism; testicular atrophy and infertility. Recent studies suggesting an increased incidence of heart attack and stroke in patients taking testosterone was discussed. He was informed there is conflicting evidence regarding the impact of testosterone therapy on cardiovascular risk. The theoretical risk of growth stimulation of an undetected prostate cancer was also discussed.  He was informed that current evidence does not provide any definitive answers regarding the risks of testosterone therapy on prostate cancer and cardiovascular disease. The need for periodic monitoring of his testosterone level, PSA, hematocrit and DRE was discussed.     Ernest Altes, MD   [facility] [address] [place], [place] [address] [phone]  See 11/619 note from Dr. Lonna Cobb ordering testosterone  Inject 1.0 ml (200mg /ml) into the muscle every 14 days.  states he's to go back to Dr. [name] office after he receives 2nd injection on 12/24/2019 for labwork.  Advised him to wait until after he gets lab results before calling pharmacy for next dose of medication.  AMD  AMD

## 2019-12-16 DIAGNOSIS — Z6841 Body Mass Index (BMI) 40.0 and over, adult: Secondary | ICD-10-CM | POA: Diagnosis not present

## 2019-12-16 DIAGNOSIS — N1831 Chronic kidney disease, stage 3a: Secondary | ICD-10-CM | POA: Diagnosis not present

## 2019-12-16 DIAGNOSIS — I10 Essential (primary) hypertension: Secondary | ICD-10-CM | POA: Diagnosis not present

## 2019-12-16 DIAGNOSIS — R801 Persistent proteinuria, unspecified: Secondary | ICD-10-CM | POA: Diagnosis not present

## 2019-12-24 ENCOUNTER — Other Ambulatory Visit: Payer: Self-pay

## 2019-12-24 ENCOUNTER — Ambulatory Visit: Payer: Self-pay

## 2019-12-24 DIAGNOSIS — E291 Testicular hypofunction: Secondary | ICD-10-CM

## 2019-12-28 ENCOUNTER — Telehealth: Payer: Self-pay

## 2019-12-28 NOTE — Telephone Encounter (Signed)
Ernest [name] states he never had home sleep study.  Requested the sleep study - said his nephrologist recommended he still have it.  Faxed sleep study referral to [facility]  12/28/19.  AMD

## 2020-01-05 ENCOUNTER — Telehealth: Payer: Self-pay

## 2020-01-05 NOTE — Telephone Encounter (Signed)
Informed [name] we received notification from Ernest [name] (Sleep Study Referral 12/01/19) stating they contacted him to schedule the sleep study, but he refused because insurance is out of network & he's supposed to be contact insurance to verify.  [name] states the [employer] representative told him he would have to pay $750.00 up front & that they were out of network with his insurance. Asked if he's contacted [employer] or [name] ([facility] [employer]) about the network status of Ernest [address] with [employer].  States he hasn't reached out to either one.  Encouraged him to contact [name] & that we've referred several other individuals to this company & none of them have reported this issue.  Also, advised him to call us back if he needs [place] to do anything else.  AMD

## 2020-01-07 ENCOUNTER — Other Ambulatory Visit: Payer: Self-pay

## 2020-01-07 ENCOUNTER — Ambulatory Visit: Payer: Self-pay

## 2020-01-07 DIAGNOSIS — E291 Testicular hypofunction: Secondary | ICD-10-CM

## 2020-01-17 ENCOUNTER — Other Ambulatory Visit: Payer: Self-pay

## 2020-01-18 ENCOUNTER — Other Ambulatory Visit: Payer: Self-pay

## 2020-01-18 ENCOUNTER — Other Ambulatory Visit: Payer: Self-pay | Admitting: *Deleted

## 2020-01-18 ENCOUNTER — Other Ambulatory Visit: Payer: 59

## 2020-01-18 DIAGNOSIS — E291 Testicular hypofunction: Secondary | ICD-10-CM

## 2020-01-19 LAB — HEMATOCRIT: Hematocrit: 41.5 % (ref 37.5–51.0)

## 2020-01-19 LAB — TESTOSTERONE: Testosterone: 551 ng/dL (ref 264–916)

## 2020-01-20 ENCOUNTER — Other Ambulatory Visit: Payer: Self-pay

## 2020-01-20 ENCOUNTER — Encounter: Payer: Self-pay | Admitting: Urology

## 2020-01-20 ENCOUNTER — Ambulatory Visit (INDEPENDENT_AMBULATORY_CARE_PROVIDER_SITE_OTHER): Payer: 59 | Admitting: Urology

## 2020-01-20 VITALS — BP 128/79 | HR 103 | Ht 70.0 in | Wt 272.0 lb

## 2020-01-20 DIAGNOSIS — E291 Testicular hypofunction: Secondary | ICD-10-CM

## 2020-01-20 NOTE — Progress Notes (Signed)
   01/20/2020 3:02 PM   Ernest [name] 03/04/1956 [id number]  Referring provider: No referring provider defined for this encounter.  Chief Complaint  Patient presents with  . Hypogonadism    HPI: 64 y.o. male presents for follow-up of hypogonadism.  He was seen in early March and testosterone levels were below castrate.  LH was elevated and prolactin normal.  He is currently receiving 200 mg every 2 weeks and has noted marked improvement in his energy level.  Testosterone level drawn 3 days ago was 551 and he is due for an injection tomorrow.  No bothersome lower urinary tract symptoms, breast changes.   PMH: Past Medical History:  Diagnosis Date  . CKD (chronic kidney disease)   . Colon polyps   . Diabetes mellitus without complication ([facility])   . Elevated lipids   . Flat feet   . Gout   . Hyperkalemia   . Hypertension   . Over weight     Surgical History: Past Surgical History:  Procedure Laterality Date  . COLONOSCOPY      Home Medications:  Allergies as of 01/20/2020   No Known Allergies     Medication List       Accurate as of January 20, 2020  3:02 PM. If you have any questions, ask your nurse or doctor.        aspirin EC 81 MG tablet Take by mouth.   atorvastatin 20 MG tablet Commonly known as: LIPITOR Take by mouth.   atorvastatin 40 MG tablet Commonly known as: LIPITOR TAKE 1 TABLET BY MOUTH ONCE DAILY. DISCONTINUE ATORVASTATIN 20MG .   chlorthalidone 25 MG tablet Commonly known as: HYGROTON Take 1/2 tablet by mouth every morning   CoQ10 30 MG Caps Take 30 mg by mouth 3 (three) times daily.   glipiZIDE 5 MG 24 hr tablet Commonly known as: GLUCOTROL XL Take 5 mg by mouth daily.   Janumet XR 50-1000 MG Tb24 Generic drug: SitaGLIPtin-MetFORMIN HCl Take 2 tablets by mouth once daily   lisinopril 10 MG tablet Commonly known as: ZESTRIL Take 1 tablet (10 mg total) by mouth daily.   testosterone cypionate 200 MG/ML injection Commonly known  as: DEPOTESTOSTERONE CYPIONATE Inject 1 mL (200 mg total) into the muscle every 14 (fourteen) days.       Allergies: No Known Allergies  Family History: Family History  Problem Relation Age of Onset  . Diabetes Mother   . Diabetes Paternal Grandmother   . Hypertension Paternal Grandmother   . Diabetes Brother   . Blindness Brother        due to DM    Social History:  reports that he has never smoked. He has never used smokeless tobacco. No history on file for alcohol and drug.   Physical Exam: BP 128/79   Pulse (!) 103   Ht 5\' 10"  (1.778 m)   Wt 272 lb (123.4 kg)   BMI 39.03 kg/m   Constitutional:  Alert and oriented, No acute distress. HEENT: [place] AT, moist mucus membranes.  Trachea midline, no masses. Cardiovascular: No clubbing, cyanosis, or edema. Respiratory: Normal respiratory effort, no increased work of breathing.   Assessment & Plan:    - Hypogonadism Significant improvement on TRT.  We will continue present dose.  Follow-up 6 months with testosterone, PSA and hematocrit.    , MD  [facility] [address] [place], [address] [place] [phone]

## 2020-01-21 ENCOUNTER — Ambulatory Visit: Payer: Self-pay

## 2020-01-21 DIAGNOSIS — E291 Testicular hypofunction: Secondary | ICD-10-CM

## 2020-02-04 ENCOUNTER — Ambulatory Visit: Payer: 59

## 2020-02-04 ENCOUNTER — Other Ambulatory Visit: Payer: Self-pay

## 2020-02-04 DIAGNOSIS — E291 Testicular hypofunction: Secondary | ICD-10-CM

## 2020-02-04 NOTE — Progress Notes (Signed)
Patient changed mind for location of injection after it was put on the [facility].  Injection was given in Right [place] by C. Lalla Brothers, RMA.  AMD

## 2020-02-14 ENCOUNTER — Telehealth: Payer: Self-pay | Admitting: Registered Nurse

## 2020-02-14 ENCOUNTER — Encounter: Payer: Self-pay | Admitting: Registered Nurse

## 2020-02-14 NOTE — Telephone Encounter (Signed)
Hbga1c, BMET, B12 nonfasting to be drawn at scheduled appt 02/18/2020 with RN Paschal Dopp as patient overdue for follow up labs.

## 2020-02-18 ENCOUNTER — Ambulatory Visit: Payer: Self-pay

## 2020-02-18 ENCOUNTER — Other Ambulatory Visit: Payer: Self-pay

## 2020-02-18 DIAGNOSIS — E291 Testicular hypofunction: Secondary | ICD-10-CM

## 2020-02-18 DIAGNOSIS — N1831 Chronic kidney disease, stage 3a: Secondary | ICD-10-CM

## 2020-02-19 LAB — BASIC METABOLIC PANEL
BUN/Creatinine Ratio: 14 (ref 10–24)
BUN: 21 mg/dL (ref 8–27)
CO2: 20 mmol/L (ref 20–29)
Calcium: 9.1 mg/dL (ref 8.6–10.2)
Chloride: 102 mmol/L (ref 96–106)
Creatinine, Ser: 1.53 mg/dL — ABNORMAL HIGH (ref 0.76–1.27)
GFR calc Af Amer: 56 mL/min/{1.73_m2} — ABNORMAL LOW (ref >59)
GFR calc non Af Amer: 48 mL/min/{1.73_m2} — ABNORMAL LOW (ref >59)
Glucose: 149 mg/dL — ABNORMAL HIGH (ref 65–99)
Potassium: 5 mmol/L (ref 3.5–5.2)
Sodium: 138 mmol/L (ref 134–144)

## 2020-02-19 LAB — HEMOGLOBIN A1C
Est. average glucose Bld gHb Est-mCnc: 146 mg/dL
Hgb A1c MFr Bld: 6.7 % — ABNORMAL HIGH (ref 4.8–5.6)

## 2020-02-19 LAB — TESTOSTERONE: Testosterone: 460 ng/dL (ref 264–916)

## 2020-02-19 LAB — HEMATOCRIT: Hematocrit: 44.2 % (ref 37.5–51.0)

## 2020-02-19 LAB — PSA: Prostate Specific Ag, Serum: 1.1 ng/mL (ref 0.0–4.0)

## 2020-02-19 LAB — VITAMIN B12: Vitamin B-12: 425 pg/mL (ref 232–1245)

## 2020-02-21 ENCOUNTER — Telehealth: Payer: Self-pay | Admitting: *Deleted

## 2020-02-21 NOTE — Telephone Encounter (Signed)
-----   Message from Riki Altes, MD sent at 02/21/2020  7:59 AM EDT ----- Testosterone level looks good at 460.  Hematocrit was normal.  PSA slightly increased above baseline at 1.1.  He is due for lab visit October 2021.  Please add on a PSA to those labs.

## 2020-02-21 NOTE — Telephone Encounter (Signed)
Notified patient as instructed, patient pleased. Discussed follow-up appointments, patient agrees  

## 2020-03-03 ENCOUNTER — Ambulatory Visit: Payer: Self-pay

## 2020-03-03 ENCOUNTER — Other Ambulatory Visit: Payer: Self-pay

## 2020-03-03 DIAGNOSIS — E291 Testicular hypofunction: Secondary | ICD-10-CM

## 2020-03-17 ENCOUNTER — Ambulatory Visit: Payer: Self-pay

## 2020-03-17 ENCOUNTER — Other Ambulatory Visit: Payer: Self-pay

## 2020-03-17 DIAGNOSIS — E291 Testicular hypofunction: Secondary | ICD-10-CM

## 2020-03-23 ENCOUNTER — Other Ambulatory Visit: Payer: Self-pay

## 2020-03-23 MED ORDER — GLIPIZIDE ER 5 MG PO TB24: 5.0000 mg | ORAL_TABLET | Freq: Every day | ORAL | 2 refills | Status: DC

## 2020-03-23 NOTE — Telephone Encounter (Signed)
Labs drawn 02/18/2020 and follow up scheduled with RN 03/31/2020

## 2020-03-26 ENCOUNTER — Other Ambulatory Visit: Payer: Self-pay | Admitting: Physician Assistant

## 2020-03-26 DIAGNOSIS — N1831 Chronic kidney disease, stage 3a: Secondary | ICD-10-CM

## 2020-03-26 DIAGNOSIS — E1122 Type 2 diabetes mellitus with diabetic chronic kidney disease: Secondary | ICD-10-CM

## 2020-03-30 ENCOUNTER — Other Ambulatory Visit: Payer: Self-pay

## 2020-03-30 DIAGNOSIS — N1831 Chronic kidney disease, stage 3a: Secondary | ICD-10-CM

## 2020-03-31 ENCOUNTER — Other Ambulatory Visit: Payer: Self-pay

## 2020-03-31 ENCOUNTER — Ambulatory Visit: Payer: Self-pay

## 2020-03-31 DIAGNOSIS — E291 Testicular hypofunction: Secondary | ICD-10-CM

## 2020-04-01 MED ORDER — JANUMET XR 50-1000 MG PO TB24: 2.0000 | ORAL_TABLET | Freq: Every day | ORAL | 2 refills | Status: DC

## 2020-04-14 ENCOUNTER — Other Ambulatory Visit: Payer: Self-pay

## 2020-04-14 ENCOUNTER — Ambulatory Visit: Payer: Self-pay

## 2020-04-14 DIAGNOSIS — E291 Testicular hypofunction: Secondary | ICD-10-CM

## 2020-04-21 ENCOUNTER — Other Ambulatory Visit: Payer: Self-pay | Admitting: Urology

## 2020-04-25 ENCOUNTER — Other Ambulatory Visit: Payer: Self-pay | Admitting: Urology

## 2020-04-28 ENCOUNTER — Ambulatory Visit: Payer: 59

## 2020-04-28 ENCOUNTER — Other Ambulatory Visit: Payer: Self-pay | Admitting: Urology

## 2020-04-28 NOTE — Progress Notes (Deleted)
Ernest [name] called clinic & will not come today for testosterone injection. He is out of medication & has to contact Urologist for refill. Waiting for Urology office or pharmacy to contact him to let him know med is refilled.  AMD

## 2020-05-02 ENCOUNTER — Ambulatory Visit: Payer: Self-pay

## 2020-05-02 ENCOUNTER — Other Ambulatory Visit: Payer: Self-pay

## 2020-05-02 DIAGNOSIS — E291 Testicular hypofunction: Secondary | ICD-10-CM

## 2020-05-12 ENCOUNTER — Ambulatory Visit: Payer: Self-pay

## 2020-05-12 ENCOUNTER — Other Ambulatory Visit: Payer: Self-pay

## 2020-05-12 DIAGNOSIS — E291 Testicular hypofunction: Secondary | ICD-10-CM

## 2020-05-19 ENCOUNTER — Ambulatory Visit: Payer: 59

## 2020-05-26 ENCOUNTER — Ambulatory Visit: Payer: Self-pay

## 2020-05-26 ENCOUNTER — Other Ambulatory Visit: Payer: Self-pay

## 2020-05-26 DIAGNOSIS — E291 Testicular hypofunction: Secondary | ICD-10-CM

## 2020-05-26 MED ORDER — TESTOSTERONE CYPIONATE 200 MG/ML IM SOLN
200.0000 mg | INTRAMUSCULAR | Status: DC
  Administered 2020-05-26 – 2020-09-01 (×8): 200 mg via INTRAMUSCULAR

## 2020-06-09 ENCOUNTER — Other Ambulatory Visit: Payer: Self-pay

## 2020-06-09 ENCOUNTER — Ambulatory Visit: Payer: Self-pay

## 2020-06-09 DIAGNOSIS — E291 Testicular hypofunction: Secondary | ICD-10-CM

## 2020-06-09 NOTE — Progress Notes (Signed)
Patient in clinic.

## 2020-06-23 ENCOUNTER — Ambulatory Visit: Payer: Self-pay

## 2020-06-23 ENCOUNTER — Other Ambulatory Visit: Payer: Self-pay

## 2020-06-23 DIAGNOSIS — E291 Testicular hypofunction: Secondary | ICD-10-CM

## 2020-06-23 NOTE — Progress Notes (Signed)
Pt presented today for his testosterone injection. CL,RMA

## 2020-07-07 ENCOUNTER — Other Ambulatory Visit: Payer: Self-pay

## 2020-07-07 ENCOUNTER — Ambulatory Visit: Payer: Self-pay

## 2020-07-07 DIAGNOSIS — E291 Testicular hypofunction: Secondary | ICD-10-CM

## 2020-07-18 ENCOUNTER — Other Ambulatory Visit: Payer: Self-pay | Admitting: Family Medicine

## 2020-07-18 DIAGNOSIS — E291 Testicular hypofunction: Secondary | ICD-10-CM

## 2020-07-21 ENCOUNTER — Other Ambulatory Visit: Payer: 59

## 2020-07-21 ENCOUNTER — Ambulatory Visit: Payer: Self-pay

## 2020-07-21 ENCOUNTER — Other Ambulatory Visit: Payer: Self-pay

## 2020-07-21 DIAGNOSIS — E291 Testicular hypofunction: Secondary | ICD-10-CM | POA: Diagnosis not present

## 2020-07-22 LAB — HEMATOCRIT: Hematocrit: 49.8 % (ref 37.5–51.0)

## 2020-07-22 LAB — PSA: Prostate Specific Ag, Serum: 2 ng/mL (ref 0.0–4.0)

## 2020-07-22 LAB — TESTOSTERONE: Testosterone: 555 ng/dL (ref 264–916)

## 2020-07-26 NOTE — Progress Notes (Signed)
07/27/2020 2:24 PM   Sylvestre Enrigue Catena [birth year] [id number]  Referring provider: No referring provider defined for this encounter. Chief Complaint  Patient presents with  . Hypogonadism    HPI: Ernest [name] is a 64 y.o. male who returns for a 6 month follow up of hypogonadism.   -Recent labs from 07/21/2020 include PSA 2.0, testosterone 555, hematocrit 49.8. -The patient is injecting 200 mg every 2 weeks with marked improvement. -No bothersome urinary tract symptoms.      PMH: Past Medical History:  Diagnosis Date  . CKD (chronic kidney disease)   . Colon polyps   . Diabetes mellitus without complication (HCC)   . Elevated lipids   . Flat feet   . Gout   . Hyperkalemia   . Hypertension   . Over weight     Surgical History: Past Surgical History:  Procedure Laterality Date  . COLONOSCOPY      Home Medications:  Allergies as of 07/27/2020   No Known Allergies     Medication List       Accurate as of July 27, 2020  2:24 PM. If you have any questions, ask your nurse or doctor.        aspirin EC 81 MG tablet Take by mouth.   atorvastatin 20 MG tablet Commonly known as: LIPITOR Take by mouth.   atorvastatin 40 MG tablet Commonly known as: LIPITOR TAKE 1 TABLET BY MOUTH ONCE DAILY. DISCONTINUE ATORVASTATIN 20MG .   chlorthalidone 25 MG tablet Commonly known as: HYGROTON Take 1/2 tablet by mouth every morning   CoQ10 30 MG Caps Take 30 mg by mouth 3 (three) times daily.   glipiZIDE 5 MG 24 hr tablet Commonly known as: GLUCOTROL XL Take 1 tablet (5 mg total) by mouth daily.   Janumet XR 50-1000 MG Tb24 Generic drug: SitaGLIPtin-MetFORMIN HCl Take 2 tablets by mouth daily.   lisinopril 10 MG tablet Commonly known as: ZESTRIL Take 1 tablet (10 mg total) by mouth daily.   testosterone cypionate 200 MG/ML injection Commonly known as: DEPOTESTOSTERONE CYPIONATE Inject 200 mg into the muscle every 14 (fourteen) days.       Allergies:  No Known Allergies  Family History: Family History  Problem Relation Age of Onset  . Diabetes Mother   . Diabetes Paternal Grandmother   . Hypertension Paternal Grandmother   . Diabetes Brother   . Blindness Brother        due to DM    Social History:  reports that he has never smoked. He has never used smokeless tobacco. No history on file for alcohol use and drug use.   Physical Exam: BP 130/80   Pulse 74   Ht 5\' 10"  (1.778 m)   Wt 282 lb (127.9 kg)   BMI 40.46 kg/m   Constitutional:  Alert and oriented, No acute distress. HEENT: [place] AT, moist mucus membranes.  Trachea midline, no masses. Cardiovascular: No clubbing, cyanosis, or edema. Respiratory: Normal respiratory effort, no increased work of breathing. GI: Abdomen is soft, nontender, nondistended, no abdominal masses GU: No CVA tenderness. 35 g prostate smooth no nodules, non-tender Lymph: No cervical or inguinal lymphadenopathy. Skin: No rashes, bruises or suspicious lesions. Neurologic: Grossly intact, no focal deficits, moving all 4 extremities. Psychiatric: Normal mood and affect.   Assessment & Plan:    1. Hypogonadism  Doing well on TRT.  Testosterone 555 and hematocrit 49.8 on 07/21/2020. Follow up in 6 months with testosterone, PSA and hematocrit  2. Abnormal PSA velocity  PSA has significantly increased since 11/2019. Recommend prostate MRI; if no suspicious lesions will need standard prostate biopsy if he desires to continue TRT  [facility] [address] [place], [place] [address] [phone]  I, Theador Hawthorne, am acting as a scribe for Dr. Lorin Picket C. [place] Baldwin,  I have reviewed the above documentation for accuracy and completeness, and I agree with the above.    Riki Altes, MD

## 2020-07-27 ENCOUNTER — Ambulatory Visit (INDEPENDENT_AMBULATORY_CARE_PROVIDER_SITE_OTHER): Payer: 59 | Admitting: Urology

## 2020-07-27 ENCOUNTER — Encounter: Payer: Self-pay | Admitting: Urology

## 2020-07-27 ENCOUNTER — Other Ambulatory Visit: Payer: Self-pay

## 2020-07-27 VITALS — BP 146/87 | HR 97 | Ht 70.0 in | Wt 282.0 lb

## 2020-07-27 DIAGNOSIS — E291 Testicular hypofunction: Secondary | ICD-10-CM

## 2020-07-27 DIAGNOSIS — R972 Elevated prostate specific antigen [PSA]: Secondary | ICD-10-CM | POA: Diagnosis not present

## 2020-08-04 ENCOUNTER — Ambulatory Visit: Payer: Self-pay

## 2020-08-04 ENCOUNTER — Other Ambulatory Visit: Payer: Self-pay

## 2020-08-04 DIAGNOSIS — Z23 Encounter for immunization: Secondary | ICD-10-CM

## 2020-08-04 DIAGNOSIS — E291 Testicular hypofunction: Secondary | ICD-10-CM

## 2020-08-18 ENCOUNTER — Ambulatory Visit: Payer: Self-pay

## 2020-08-18 ENCOUNTER — Other Ambulatory Visit: Payer: Self-pay

## 2020-08-18 DIAGNOSIS — E291 Testicular hypofunction: Secondary | ICD-10-CM

## 2020-08-21 DIAGNOSIS — N1831 Chronic kidney disease, stage 3a: Secondary | ICD-10-CM | POA: Diagnosis not present

## 2020-08-21 DIAGNOSIS — R801 Persistent proteinuria, unspecified: Secondary | ICD-10-CM | POA: Diagnosis not present

## 2020-08-21 DIAGNOSIS — I10 Essential (primary) hypertension: Secondary | ICD-10-CM | POA: Diagnosis not present

## 2020-08-28 DIAGNOSIS — Z6841 Body Mass Index (BMI) 40.0 and over, adult: Secondary | ICD-10-CM | POA: Diagnosis not present

## 2020-08-28 DIAGNOSIS — R801 Persistent proteinuria, unspecified: Secondary | ICD-10-CM | POA: Diagnosis not present

## 2020-08-28 DIAGNOSIS — I10 Essential (primary) hypertension: Secondary | ICD-10-CM | POA: Diagnosis not present

## 2020-08-28 DIAGNOSIS — N1831 Chronic kidney disease, stage 3a: Secondary | ICD-10-CM | POA: Diagnosis not present

## 2020-08-29 ENCOUNTER — Other Ambulatory Visit: Payer: Self-pay

## 2020-08-29 ENCOUNTER — Ambulatory Visit
Admission: RE | Admit: 2020-08-29 | Discharge: 2020-08-29 | Disposition: A | Discharge status: Home / Self Care | Payer: 59 | Source: Ambulatory Visit | Attending: Urology | Admitting: Urology

## 2020-08-29 DIAGNOSIS — R590 Localized enlarged lymph nodes: Secondary | ICD-10-CM | POA: Diagnosis not present

## 2020-08-29 DIAGNOSIS — R972 Elevated prostate specific antigen [PSA]: Secondary | ICD-10-CM | POA: Insufficient documentation

## 2020-08-29 IMAGING — MR MR PROSTATE WO/W CM
56 series · 56 of 56 positions shown · IV contrast (10ml Gadavist)
Comparison: None.

CLINICAL DATA: Abnormal PSA, elevated.

EXAM:
MR PROSTATE WITHOUT AND WITH CONTRAST
TECHNIQUE: Multiplanar multisequence MRI images were obtained of the pelvis
centered about the prostate. Pre and post contrast images were
obtained.
CONTRAST:  10mL GADAVIST GADOBUTROL 1 MMOL/ML IV SOLN

[Series 3: ax in&out whole · axial · 6.0mm · 0.74mm/px · 1 of 35 slices shown (1 of 2)]
[im 1/35]
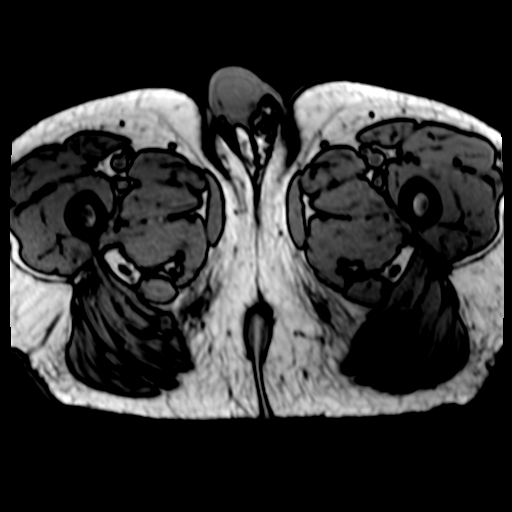

[Series 3: ax in&out whole · axial · 6.0mm · 0.74mm/px · 1 of 35 slices shown (2 of 2)]
[im 1/35]
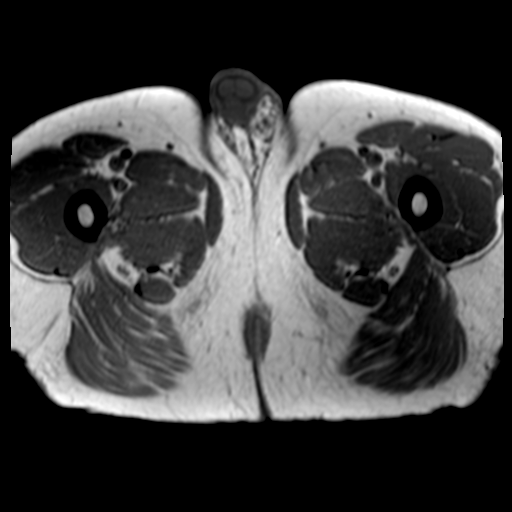

[Series 4: T2 · axial · 3.0mm · 0.56mm/px · 1 of 25 slices shown (1 of 3)]
[im 1/25]
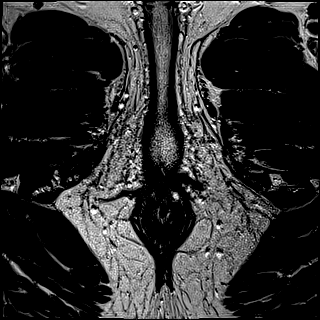

[Series 5: T2 · coronal · 3.0mm · 0.70mm/px · 1 of 35 slices shown (2 of 3)]
[im 1/35]
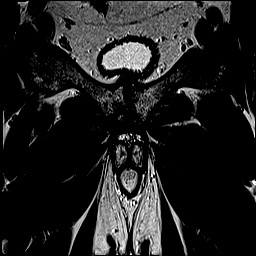

[Series 6: DWI · axial · 3.0mm · 0.86mm/px · 1 of 75 slices shown (1 of 3)]
[im 1/75]
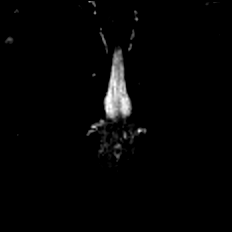

[Series 7: DWI · axial · 3.0mm · 0.86mm/px · 1 of 25 slices shown (2 of 3)]
[im 1/25]
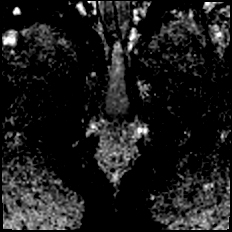

[Series 8: DWI · axial · 3.0mm · 0.86mm/px · 1 of 25 slices shown (3 of 3)]
[im 1/25]
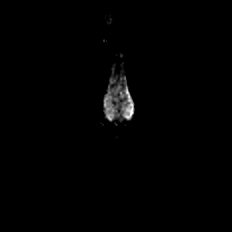

[Series 9: T2 · axial · 1.0mm · 1.04mm/px · 1 of 80 slices shown (3 of 3)]
[im 1/80]
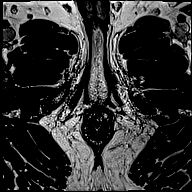

[Series 10: T1 · axial · 3.0mm · 1.15mm/px · 1 of 28 slices shown (1 of 48)]
[im 1/28]
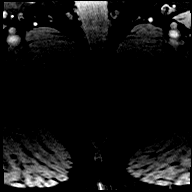

[Series 11: T1 · axial · 3.0mm · 1.15mm/px · 1 of 28 slices shown (2 of 48)]
[im 1/28]
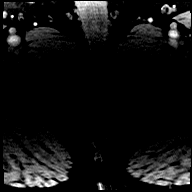

[Series 12: T1 · axial · 3.0mm · 1.15mm/px · 1 of 28 slices shown (3 of 48)]
[im 1/28]
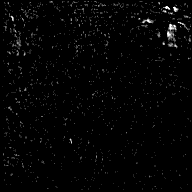

[Series 13: T1 · axial · 3.0mm · 1.15mm/px · 1 of 28 slices shown (4 of 48)]
[im 1/28]
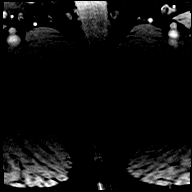

[Series 14: T1 · axial · 3.0mm · 1.15mm/px · 1 of 28 slices shown (5 of 48)]
[im 1/28]
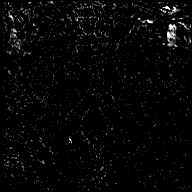

[Series 15: T1 · axial · 3.0mm · 1.15mm/px · 1 of 28 slices shown (6 of 48)]
[im 1/28]
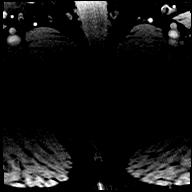

[Series 16: T1 · axial · 3.0mm · 1.15mm/px · 1 of 27 slices shown (7 of 48)]
[im 1/27]
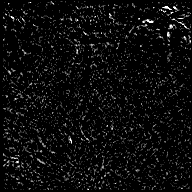

[Series 17: T1 · axial · 3.0mm · 1.15mm/px · 1 of 28 slices shown (8 of 48)]
[im 1/28]
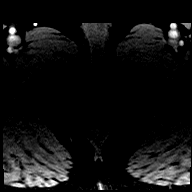

[Series 18: T1 · axial · 3.0mm · 1.15mm/px · 1 of 28 slices shown (9 of 48)]
[im 1/28]
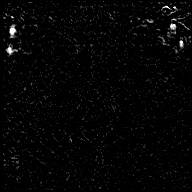

[Series 19: T1 · axial · 3.0mm · 1.15mm/px · 1 of 28 slices shown (10 of 48)]
[im 1/28]
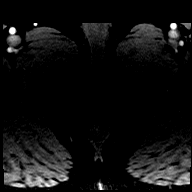

[Series 20: T1 · axial · 3.0mm · 1.15mm/px · 1 of 28 slices shown (11 of 48)]
[im 1/28]
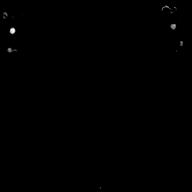

[Series 21: T1 · axial · 3.0mm · 1.15mm/px · 1 of 28 slices shown (12 of 48)]
[im 1/28]
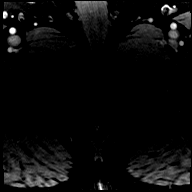

[Series 22: T1 · axial · 3.0mm · 1.15mm/px · 1 of 28 slices shown (13 of 48)]
[im 1/28]
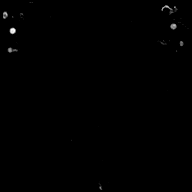

[Series 23: T1 · axial · 3.0mm · 1.15mm/px · 1 of 28 slices shown (14 of 48)]
[im 1/28]
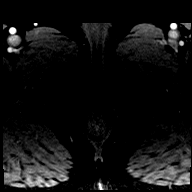

[Series 24: T1 · axial · 3.0mm · 1.15mm/px · 1 of 28 slices shown (15 of 48)]
[im 1/28]
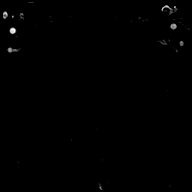

[Series 25: T1 · axial · 3.0mm · 1.15mm/px · 1 of 28 slices shown (16 of 48)]
[im 1/28]
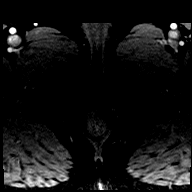

[Series 26: T1 · axial · 3.0mm · 1.15mm/px · 1 of 28 slices shown (17 of 48)]
[im 1/28]
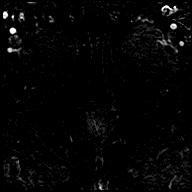

[Series 27: T1 · axial · 3.0mm · 1.15mm/px · 1 of 28 slices shown (18 of 48)]
[im 1/28]
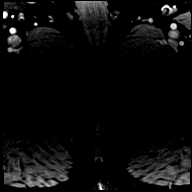

[Series 28: T1 · axial · 3.0mm · 1.15mm/px · 1 of 28 slices shown (19 of 48)]
[im 1/28]
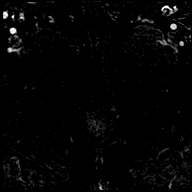

[Series 29: T1 · axial · 3.0mm · 1.15mm/px · 1 of 28 slices shown (20 of 48)]
[im 1/28]
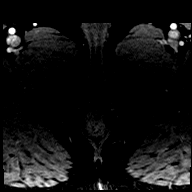

[Series 30: T1 · axial · 3.0mm · 1.15mm/px · 1 of 28 slices shown (21 of 48)]
[im 1/28]
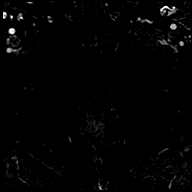

[Series 31: T1 · axial · 3.0mm · 1.15mm/px · 1 of 28 slices shown (22 of 48)]
[im 1/28]
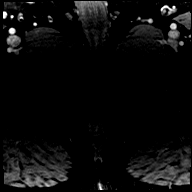

[Series 32: T1 · axial · 3.0mm · 1.15mm/px · 1 of 28 slices shown (23 of 48)]
[im 1/28]
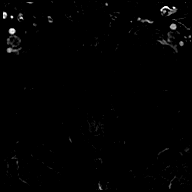

[Series 33: T1 · axial · 3.0mm · 1.15mm/px · 1 of 28 slices shown (24 of 48)]
[im 1/28]
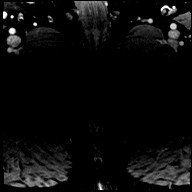

[Series 34: T1 · axial · 3.0mm · 1.15mm/px · 1 of 28 slices shown (25 of 48)]
[im 1/28]
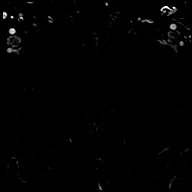

[Series 35: T1 · axial · 3.0mm · 1.15mm/px · 1 of 28 slices shown (26 of 48)]
[im 1/28]
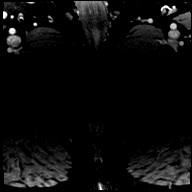

[Series 36: T1 · axial · 3.0mm · 1.15mm/px · 1 of 28 slices shown (27 of 48)]
[im 1/28]
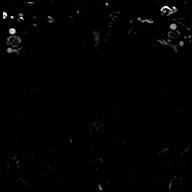

[Series 37: T1 · axial · 3.0mm · 1.15mm/px · 1 of 28 slices shown (28 of 48)]
[im 1/28]
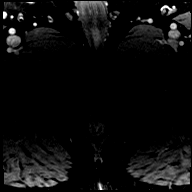

[Series 38: T1 · axial · 3.0mm · 1.15mm/px · 1 of 28 slices shown (29 of 48)]
[im 1/28]
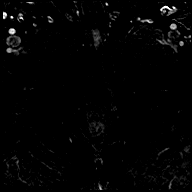

[Series 39: T1 · axial · 3.0mm · 1.15mm/px · 1 of 28 slices shown (30 of 48)]
[im 1/28]
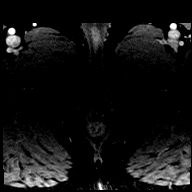

[Series 40: T1 · axial · 3.0mm · 1.15mm/px · 1 of 28 slices shown (31 of 48)]
[im 1/28]
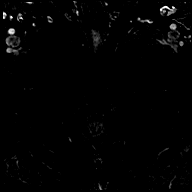

[Series 41: T1 · axial · 3.0mm · 1.15mm/px · 1 of 28 slices shown (32 of 48)]
[im 1/28]
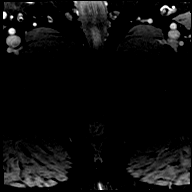

[Series 42: T1 · axial · 3.0mm · 1.15mm/px · 1 of 28 slices shown (33 of 48)]
[im 1/28]
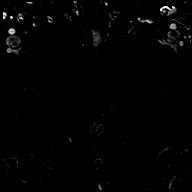

[Series 43: T1 · axial · 3.0mm · 1.15mm/px · 1 of 28 slices shown (34 of 48)]
[im 1/28]
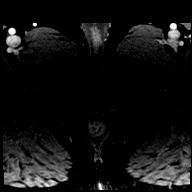

[Series 44: T1 · axial · 3.0mm · 1.15mm/px · 1 of 28 slices shown (35 of 48)]
[im 1/28]
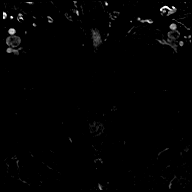

[Series 45: T1 · axial · 3.0mm · 1.15mm/px · 1 of 28 slices shown (36 of 48)]
[im 1/28]
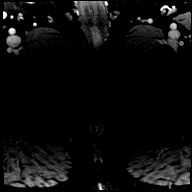

[Series 46: T1 · axial · 3.0mm · 1.15mm/px · 1 of 28 slices shown (37 of 48)]
[im 1/28]
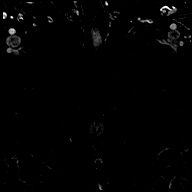

[Series 47: T1 · axial · 3.0mm · 1.15mm/px · 1 of 28 slices shown (38 of 48)]
[im 1/28]
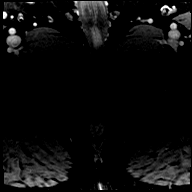

[Series 48: T1 · axial · 3.0mm · 1.15mm/px · 1 of 28 slices shown (39 of 48)]
[im 1/28]
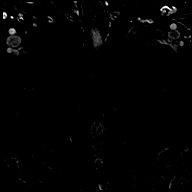

[Series 49: T1 · axial · 3.0mm · 1.15mm/px · 1 of 28 slices shown (40 of 48)]
[im 1/28]
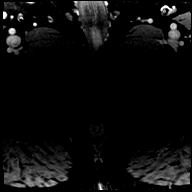

[Series 50: T1 · axial · 3.0mm · 1.15mm/px · 1 of 28 slices shown (41 of 48)]
[im 1/28]
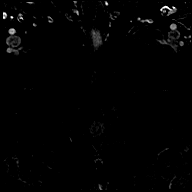

[Series 51: T1 · axial · 3.0mm · 1.15mm/px · 1 of 28 slices shown (42 of 48)]
[im 1/28]
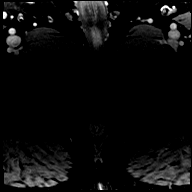

[Series 52: T1 · axial · 3.0mm · 1.15mm/px · 1 of 28 slices shown (43 of 48)]
[im 1/28]
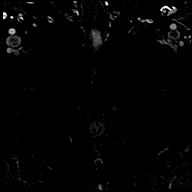

[Series 53: T1 · axial · 3.0mm · 1.15mm/px · 1 of 28 slices shown (44 of 48)]
[im 1/28]
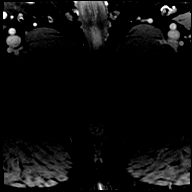

[Series 54: T1 · axial · 3.0mm · 1.15mm/px · 1 of 28 slices shown (45 of 48)]
[im 1/28]
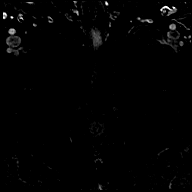

[Series 55: T1 · axial · 3.0mm · 1.15mm/px · 1 of 28 slices shown (46 of 48)]
[im 1/28]
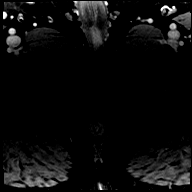

[Series 56: T1 · axial · 3.0mm · 1.15mm/px · 1 of 28 slices shown (47 of 48)]
[im 1/28]
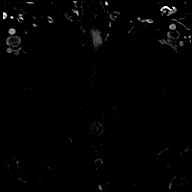

[Series 57: T1 · axial · 3.0mm · 1.15mm/px · 1 of 28 slices shown (48 of 48)]
[im 1/28]
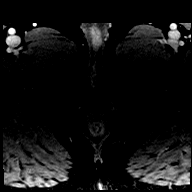

[56 of 56 positions shown; findings below may reference images not displayed]

FINDINGS: Prostate: Diffuse reduced T2 signal throughout the peripheral zone
with some minimal sparing in the left basilar anterior peripheral
zone, with diffuse accentuated enhancement. Given the lack of focal
lesion this is considered PI-RADS category 2, and more likely to be
chronic postinflammatory signal.

Encapsulated nodularity in the central gland compatible with benign
prostatic hypertrophy.

After careful multiparametric scrutiny, I do not identify a focal
lesion of intermediate or higher suspicion for prostate cancer.

Volume: 3D volumetric analysis: Prostate volume 59.00 cubic cm (5.3
by 4.2 by 5.3 cm).

Transcapsular spread:  Absent

Seminal vesicle involvement: Absent

Neurovascular bundle involvement: Absent

Pelvic adenopathy: Absent

Bone metastasis: Absent

Other findings: No supplemental non-categorized findings.
IMPRESSION: 1. No focal lesion of intermediate or higher suspicion for prostate
cancer.
2. Diminished T2 signal throughout the peripheral zone with
associated diffuse enhancement. Given the lack of focal lesion this
is considered PI-RADS category 2, and more likely to be chronic
postinflammatory signal.
3. Encapsulated nodularity in the central gland compatible with
benign prostatic hypertrophy. Prostate volume 59.00 cubic cm.

## 2020-08-29 MED ORDER — GADOBUTROL 1 MMOL/ML IV SOLN
10.0000 mL | Freq: Once | INTRAVENOUS | Status: AC | PRN
  Administered 2020-08-29: 10 mL via INTRAVENOUS

## 2020-08-30 ENCOUNTER — Telehealth: Payer: Self-pay | Admitting: Urology

## 2020-08-30 NOTE — Telephone Encounter (Signed)
Notified patient as instructed, Discussed follow-up appointments

## 2020-08-30 NOTE — Telephone Encounter (Signed)
Prostate MRI showed no lesions suspicious for high-grade prostate cancer.  The false negative rate of an MRI is approximately 50% and since the PSA has significantly increased while on testosterone replacement will need to have prostate biopsy if he desires to remain on testosterone.  Can schedule an office visit if he wants to discuss further or can go ahead and schedule the biopsy.

## 2020-09-01 ENCOUNTER — Other Ambulatory Visit: Payer: Self-pay

## 2020-09-01 ENCOUNTER — Ambulatory Visit: Payer: Self-pay

## 2020-09-01 DIAGNOSIS — E291 Testicular hypofunction: Secondary | ICD-10-CM

## 2020-09-07 ENCOUNTER — Encounter: Payer: Self-pay | Admitting: Urology

## 2020-09-07 ENCOUNTER — Ambulatory Visit (INDEPENDENT_AMBULATORY_CARE_PROVIDER_SITE_OTHER): Payer: 59 | Admitting: Urology

## 2020-09-07 ENCOUNTER — Other Ambulatory Visit: Payer: Self-pay

## 2020-09-07 VITALS — BP 138/82 | HR 114 | Ht 70.0 in | Wt 282.0 lb

## 2020-09-07 DIAGNOSIS — R972 Elevated prostate specific antigen [PSA]: Secondary | ICD-10-CM

## 2020-09-07 DIAGNOSIS — E291 Testicular hypofunction: Secondary | ICD-10-CM

## 2020-09-08 ENCOUNTER — Encounter: Payer: Self-pay | Admitting: Urology

## 2020-09-08 MED ORDER — TAMSULOSIN HCL 0.4 MG PO CAPS: 0.4000 mg | ORAL_CAPSULE | Freq: Every day | ORAL | 0 refills | Status: AC

## 2020-09-08 NOTE — Progress Notes (Signed)
   09/07/2020 7:06 AM   Ernest [name] 1956/02/26 [id number]  Referring provider: No referring provider defined for this encounter.  Chief Complaint  Patient presents with  . Other    HPI: 64 y.o. male with hypogonadism and abnormal PSA velocity presents for follow-up.   PSA rise 0.3-2.0 since March 2021  Benign DRE  Prostate MRI 08/29/2020; 59 g gland, PI-RADS 2 lesion throughout peripheral zone felt secondary to inflammation   PMH: Past Medical History:  Diagnosis Date  . CKD (chronic kidney disease)   . Colon polyps   . Diabetes mellitus without complication (HCC)   . Elevated lipids   . Flat feet   . Gout   . Hyperkalemia   . Hypertension   . Over weight     Surgical History: Past Surgical History:  Procedure Laterality Date  . COLONOSCOPY      Home Medications:  Allergies as of 09/07/2020   No Known Allergies     Medication List       Accurate as of September 07, 2020 11:59 PM. If you have any questions, ask your nurse or doctor.        aspirin EC 81 MG tablet Take by mouth.   atorvastatin 40 MG tablet Commonly known as: LIPITOR TAKE 1 TABLET BY MOUTH ONCE DAILY. DISCONTINUE ATORVASTATIN 20MG .   chlorthalidone 25 MG tablet Commonly known as: HYGROTON Take 1/2 tablet by mouth every morning   glipiZIDE 5 MG 24 hr tablet Commonly known as: GLUCOTROL XL Take 1 tablet (5 mg total) by mouth daily.   Janumet XR 50-1000 MG Tb24 Generic drug: SitaGLIPtin-MetFORMIN HCl Take 2 tablets by mouth daily.   lisinopril 10 MG tablet Commonly known as: ZESTRIL Take 1 tablet (10 mg total) by mouth daily.   testosterone cypionate 200 MG/ML injection Commonly known as: DEPOTESTOSTERONE CYPIONATE Inject 200 mg into the muscle every 14 (fourteen) days.       Allergies: No Known Allergies  Family History: Family History  Problem Relation Age of Onset  . Diabetes Mother   . Diabetes Paternal Grandmother   . Hypertension Paternal Grandmother   .  Diabetes Brother   . Blindness Brother        due to DM    Social History:  reports that he has never smoked. He has never used smokeless tobacco. No history on file for alcohol use and drug use.   Physical Exam: BP 138/82   Pulse (!) 114   Ht 5\' 10"  (1.778 m)   Wt 282 lb (127.9 kg)   BMI 40.46 kg/m   Constitutional:  Alert and oriented, No acute distress.   Assessment & Plan:    1.  Abnormal PSA velocity  Prostate MRI showed no lesions suspicious for high-grade prostate cancer.  We discussed we discussed the false-negative rate of MRI at 15-20%  MRI did show some radiographic evidence of chronic inflammation which would be the most likely cause of his abnormal PSA velocity  Recommend empiric 30-day alpha-blocker trial with a repeat PSA and 4-6 weeks; if PSA remains abnormal will schedule standard prostate biopsy  He is in agreement with this plan   , MD  [facility] [address], [address] [place], [address] [place] [phone]

## 2020-09-12 ENCOUNTER — Other Ambulatory Visit: Payer: Self-pay | Admitting: Urology

## 2020-09-15 ENCOUNTER — Ambulatory Visit: Payer: Self-pay

## 2020-09-15 ENCOUNTER — Other Ambulatory Visit: Payer: Self-pay

## 2020-09-15 DIAGNOSIS — E291 Testicular hypofunction: Secondary | ICD-10-CM

## 2020-09-15 MED ORDER — TESTOSTERONE CYPIONATE 200 MG/ML IM SOLN
200.0000 mg | INTRAMUSCULAR | Status: AC
  Administered 2020-09-15 – 2020-12-01 (×6): 200 mg via INTRAMUSCULAR

## 2020-09-28 ENCOUNTER — Ambulatory Visit: Payer: Self-pay

## 2020-09-28 ENCOUNTER — Other Ambulatory Visit: Payer: Self-pay

## 2020-09-28 DIAGNOSIS — E291 Testicular hypofunction: Secondary | ICD-10-CM

## 2020-10-10 ENCOUNTER — Other Ambulatory Visit: Payer: Self-pay

## 2020-10-10 ENCOUNTER — Other Ambulatory Visit: Payer: 59

## 2020-10-10 DIAGNOSIS — R972 Elevated prostate specific antigen [PSA]: Secondary | ICD-10-CM

## 2020-10-10 DIAGNOSIS — E291 Testicular hypofunction: Secondary | ICD-10-CM | POA: Diagnosis not present

## 2020-10-11 LAB — PSA: Prostate Specific Ag, Serum: 3.3 ng/mL (ref 0.0–4.0)

## 2020-10-12 ENCOUNTER — Telehealth: Payer: Self-pay | Admitting: Family Medicine

## 2020-10-12 NOTE — Telephone Encounter (Signed)
Patient notified and voiced understanding. Patient states he will call back to schedule Prostate Biopsy.

## 2020-10-12 NOTE — Telephone Encounter (Signed)
Patient returned the call.  Prostate biopsy and results appointment scheduled.  Reviewed the prostate biopsy instructions with the patient.    Patient expressed understanding.

## 2020-10-12 NOTE — Telephone Encounter (Signed)
-----   Message from Riki Altes, MD sent at 10/11/2020  7:22 AM EST ----- PSA is higher at 3.3.  Recommend scheduling standard prostate biopsy in office as we previously discussed

## 2020-10-13 ENCOUNTER — Ambulatory Visit: Payer: Self-pay

## 2020-10-13 ENCOUNTER — Other Ambulatory Visit: Payer: Self-pay

## 2020-10-13 DIAGNOSIS — E291 Testicular hypofunction: Secondary | ICD-10-CM

## 2020-10-20 ENCOUNTER — Ambulatory Visit (INDEPENDENT_AMBULATORY_CARE_PROVIDER_SITE_OTHER): Payer: 59 | Admitting: Urology

## 2020-10-20 ENCOUNTER — Encounter: Payer: Self-pay | Admitting: Urology

## 2020-10-20 ENCOUNTER — Other Ambulatory Visit: Payer: Self-pay

## 2020-10-20 VITALS — BP 126/81 | HR 93 | Ht 70.0 in | Wt 282.0 lb

## 2020-10-20 DIAGNOSIS — R972 Elevated prostate specific antigen [PSA]: Secondary | ICD-10-CM

## 2020-10-20 MED ORDER — GENTAMICIN SULFATE 40 MG/ML IJ SOLN
80.0000 mg | Freq: Once | INTRAMUSCULAR | Status: AC
  Administered 2020-10-20: 80 mg via INTRAMUSCULAR

## 2020-10-20 MED ORDER — LEVOFLOXACIN 500 MG PO TABS
500.0000 mg | ORAL_TABLET | Freq: Once | ORAL | Status: AC
  Administered 2020-10-20: 500 mg via ORAL

## 2020-10-20 NOTE — Addendum Note (Signed)
Addended by: Levada Schilling on: 10/20/2020 11:19 AM   Modules accepted: Orders

## 2020-10-20 NOTE — Progress Notes (Signed)
Prostate Biopsy Procedure   Informed consent was obtained after discussing risks/benefits of the procedure.  A time out was performed to ensure correct patient identity.  Pre-Procedure: - Last PSA Level: 3.3 (abnormal PSA velocity) - Gentamicin given prophylactically - Levaquin 500 mg administered PO -Transrectal Ultrasound performed revealing a 70 gm prostate -Small median lobe, no echogenic abnormalities  Procedure: - Prostate block performed using 10 cc 1% lidocaine and biopsies taken from sextant areas, a total of 12 under ultrasound guidance.  Post-Procedure: - Patient tolerated the procedure well - He was counseled to seek immediate medical attention if experiences any severe pain, significant bleeding, or fevers - Return in one week to discuss biopsy results   Irineo Axon, MD

## 2020-10-23 LAB — SURGICAL PATHOLOGY

## 2020-10-27 ENCOUNTER — Ambulatory Visit: Payer: Self-pay

## 2020-10-27 ENCOUNTER — Other Ambulatory Visit: Payer: Self-pay

## 2020-10-27 DIAGNOSIS — E291 Testicular hypofunction: Secondary | ICD-10-CM

## 2020-10-30 ENCOUNTER — Telehealth: Payer: Self-pay | Admitting: *Deleted

## 2020-10-30 NOTE — Telephone Encounter (Signed)
Notified patient as instructed, patient pleased. Discussed follow-up appointments, patient agrees  

## 2020-10-30 NOTE — Telephone Encounter (Signed)
-----   Message from Riki Altes, MD sent at 10/30/2020  8:36 AM EST ----- May let patient know prostate biopsy showed no evidence of cancer.  Can cancel appointment for results 11/03/2020 unless he was having problems and specifically needed to keep.  Schedule lab visit for testosterone/hematocrit April 2022 and office visit with testosterone, PSA, hematocrit October 2022

## 2020-11-03 ENCOUNTER — Ambulatory Visit: Payer: 59 | Admitting: Urology

## 2020-11-07 NOTE — Progress Notes (Signed)
Pt scheduled to complete physical 11/16/20.

## 2020-11-08 ENCOUNTER — Ambulatory Visit: Payer: Self-pay

## 2020-11-08 ENCOUNTER — Other Ambulatory Visit: Payer: Self-pay

## 2020-11-08 DIAGNOSIS — Z01818 Encounter for other preprocedural examination: Secondary | ICD-10-CM

## 2020-11-08 LAB — POCT URINALYSIS DIPSTICK
Bilirubin, UA: NEGATIVE
Blood, UA: NEGATIVE
Glucose, UA: NEGATIVE
Leukocytes, UA: NEGATIVE
Nitrite, UA: NEGATIVE
Protein, UA: NEGATIVE
Spec Grav, UA: 1.02 (ref 1.010–1.025)
Urobilinogen, UA: 0.2 E.U./dL
pH, UA: 6 (ref 5.0–8.0)

## 2020-11-09 LAB — CMP12+LP+TP+TSH+6AC+PSA+CBC…
ALT: 15 IU/L (ref 0–44)
AST: 18 IU/L (ref 0–40)
Albumin/Globulin Ratio: 1.4 (ref 1.2–2.2)
Albumin: 4.2 g/dL (ref 3.8–4.8)
Alkaline Phosphatase: 61 IU/L (ref 44–121)
BUN/Creatinine Ratio: 8 — ABNORMAL LOW (ref 10–24)
BUN: 15 mg/dL (ref 8–27)
Basophils Absolute: 0 10*3/uL (ref 0.0–0.2)
Basos: 0 %
Bilirubin Total: 0.5 mg/dL (ref 0.0–1.2)
Calcium: 9.3 mg/dL (ref 8.6–10.2)
Chloride: 98 mmol/L (ref 96–106)
Chol/HDL Ratio: 5.1 ratio — ABNORMAL HIGH (ref 0.0–5.0)
Cholesterol, Total: 221 mg/dL — ABNORMAL HIGH (ref 100–199)
Creatinine, Ser: 1.83 mg/dL — ABNORMAL HIGH (ref 0.76–1.27)
EOS (ABSOLUTE): 0.1 10*3/uL (ref 0.0–0.4)
Eos: 1 %
Estimated CHD Risk: 1.1 times avg. — ABNORMAL HIGH (ref 0.0–1.0)
Free Thyroxine Index: 2.3 (ref 1.2–4.9)
GFR calc Af Amer: 44 mL/min/{1.73_m2} — ABNORMAL LOW (ref >59)
GFR calc non Af Amer: 38 mL/min/{1.73_m2} — ABNORMAL LOW (ref >59)
GGT: 24 IU/L (ref 0–65)
Globulin, Total: 2.9 g/dL (ref 1.5–4.5)
Glucose: 145 mg/dL — ABNORMAL HIGH (ref 65–99)
HDL: 43 mg/dL (ref >39)
Hematocrit: 51.4 % — ABNORMAL HIGH (ref 37.5–51.0)
Hemoglobin: 16.4 g/dL (ref 13.0–17.7)
Immature Grans (Abs): 0 10*3/uL (ref 0.0–0.1)
Immature Granulocytes: 0 %
Iron: 76 ug/dL (ref 38–169)
LDH: 175 IU/L (ref 121–224)
LDL Chol Calc (NIH): 162 mg/dL — ABNORMAL HIGH (ref 0–99)
Lymphocytes Absolute: 1.3 10*3/uL (ref 0.7–3.1)
Lymphs: 13 %
MCH: 25.7 pg — ABNORMAL LOW (ref 26.6–33.0)
MCHC: 31.9 g/dL (ref 31.5–35.7)
MCV: 81 fL (ref 79–97)
Monocytes Absolute: 1 10*3/uL — ABNORMAL HIGH (ref 0.1–0.9)
Monocytes: 10 %
Neutrophils Absolute: 7.2 10*3/uL — ABNORMAL HIGH (ref 1.4–7.0)
Neutrophils: 76 %
Phosphorus: 3.2 mg/dL (ref 2.8–4.1)
Platelets: 248 10*3/uL (ref 150–450)
Potassium: 5.3 mmol/L — ABNORMAL HIGH (ref 3.5–5.2)
Prostate Specific Ag, Serum: 2.2 ng/mL (ref 0.0–4.0)
RBC: 6.37 x10E6/uL — ABNORMAL HIGH (ref 4.14–5.80)
RDW: 16.6 % — ABNORMAL HIGH (ref 11.6–15.4)
Sodium: 137 mmol/L (ref 134–144)
T3 Uptake Ratio: 29 % (ref 24–39)
T4, Total: 7.8 ug/dL (ref 4.5–12.0)
TSH: 1.56 u[IU]/mL (ref 0.450–4.500)
Total Protein: 7.1 g/dL (ref 6.0–8.5)
Triglycerides: 90 mg/dL (ref 0–149)
Uric Acid: 7.9 mg/dL (ref 3.8–8.4)
VLDL Cholesterol Cal: 16 mg/dL (ref 5–40)
WBC: 9.7 10*3/uL (ref 3.4–10.8)

## 2020-11-09 LAB — HGB A1C W/O EAG: Hgb A1c MFr Bld: 7.8 % — ABNORMAL HIGH (ref 4.8–5.6)

## 2020-11-09 LAB — MICROALBUMIN / CREATININE URINE RATIO
Creatinine, Urine: 160.1 mg/dL
Microalb/Creat Ratio: 5 mg/g creat (ref 0–29)
Microalbumin, Urine: 8.1 ug/mL

## 2020-11-10 ENCOUNTER — Ambulatory Visit: Payer: Self-pay

## 2020-11-10 ENCOUNTER — Other Ambulatory Visit: Payer: Self-pay

## 2020-11-10 DIAGNOSIS — E291 Testicular hypofunction: Secondary | ICD-10-CM

## 2020-11-16 ENCOUNTER — Other Ambulatory Visit: Payer: Self-pay

## 2020-11-16 ENCOUNTER — Encounter: Payer: Self-pay | Admitting: Adult Medicine

## 2020-11-16 ENCOUNTER — Ambulatory Visit: Payer: Self-pay | Admitting: Adult Medicine

## 2020-11-16 VITALS — BP 122/84 | HR 92 | Temp 98.6°F | Resp 14 | Ht 70.5 in | Wt 279.0 lb

## 2020-11-16 DIAGNOSIS — Z0000 Encounter for general adult medical examination without abnormal findings: Secondary | ICD-10-CM

## 2020-11-16 MED ORDER — VITAMIN D (ERGOCALCIFEROL) 1.25 MG (50000 UNIT) PO CAPS: 50000.0000 [IU] | ORAL_CAPSULE | ORAL | 1 refills | Status: AC

## 2020-11-16 MED ORDER — VIT C-QUERCET-BIOFLV-BROMELAIN 450-250-125-50 MG PO CAPS: 2.0000 | ORAL_CAPSULE | Freq: Three times a day (TID) | ORAL | 0 refills | Status: DC

## 2020-11-16 NOTE — Patient Instructions (Signed)
Obesity, Adult Obesity is having too much body fat. Being obese means that your weight is more than what is healthy for you. BMI is a number that explains how much body fat you have. If you have a BMI of 30 or more, you are obese. Obesity is often caused by eating or drinking more calories than your body uses. Changing your lifestyle can help you lose weight. Obesity can cause serious health problems, such as:  Stroke.  Coronary artery disease (CAD).  Type 2 diabetes.  Some types of cancer, including cancers of the colon, breast, uterus, and gallbladder.  Osteoarthritis.  High blood pressure (hypertension).  High cholesterol.  Sleep apnea.  Gallbladder stones.  Infertility problems. What are the causes?  Eating meals each day that are high in calories, sugar, and fat.  Being born with genes that may make you more likely to become obese.  Having a medical condition that causes obesity.  Taking certain medicines.  Sitting a lot (having a sedentary lifestyle).  Not getting enough sleep.  Drinking a lot of drinks that have sugar in them. What increases the risk?  Having a family history of obesity.  Being an [place] American woman.  Being a Hispanic man.  Living in an area with limited access to: ? [facility], recreation centers, or sidewalks. ? Healthy food choices, such as grocery stores and farmers' markets. What are the signs or symptoms? The main sign is having too much body fat. How is this treated?  Treatment for this condition often includes changing your lifestyle. Treatment may include: ? Changing your diet. This may include making a healthy meal plan. ? Exercise. This may include activity that causes your heart to beat faster (aerobic exercise) and strength training. Work with your doctor to design a program that works for you. ? Medicine to help you lose weight. This may be used if you are not able to lose 1 pound a week after 6 weeks of healthy eating and  more exercise. ? Treating conditions that cause the obesity. ? Surgery. Options may include gastric banding and gastric bypass. This may be done if:  Other treatments have not helped to improve your condition.  You have a BMI of 40 or higher.  You have life-threatening health problems related to obesity. Follow these instructions at home: Eating and drinking  Follow advice from your doctor about what to eat and drink. Your doctor may tell you to: ? Limit fast food, sweets, and processed snack foods. ? Choose low-fat options. For example, choose low-fat milk instead of whole milk. ? Eat 5 or more servings of fruits or vegetables each day. ? Eat at home more often. This gives you more control over what you eat. ? Choose healthy foods when you eat out. ? Learn to read food labels. This will help you learn how much food is in 1 serving. ? No fat snacks available. ? Stop drinks that have a lot of sugar in them. These include soda, fruit juice, iced tea with sugar, and flavored milk.  Drink 8 ounce water 4x/d to keep your (urine) pale yellow.    Physical activity  Exercise often, as told by your doctor. Core exercise training will be discussed with doctor next: ? What types of exercise are safe for you. ? How often you should exercise.  Warm up and stretch before being active.  Do slow stretching after being active (cool down).  Rest between times of being active. Lifestyle  Work with your doctor  and a food expert (dietitian) to set a weight-loss goal that is best for you.  Limit your screen time.  Find ways to reward yourself that do not involve food.  Avoid alcohol: ? You are pregnant, may be pregnant, or are planning to become pregnant. . General instructions  Keep a weight-loss journal. Doctor will give you a 7 day menu  This can help you and doctor keep track of: ? The food that you eat. ? How much exercise you get.  Take over-the-counter and prescription  medicines only as told by your doctor.  Take vitamins and supplements only as told by your doctor.  Think about joining a support group.  Keep all follow-up visits as told by your doctor. This is important. Contact a doctor if:  You cannot meet your weight loss goal after you have changed your diet and lifestyle for 6 weeks. Get help right away if you:  Are having trouble breathing.  Are having thoughts of harming yourself. Summary  Obesity is having too much body fat.  Being obese means that your weight is more than what is healthy for you.  Work with your doctor to set a weight-loss goal.  Get regular exercise as told by your doctor. This information is not intended to replace advice given to you by your health care provider. Make sure you discuss any questions you have with your health care provider. Document Revised: 05/21/2018 Document Reviewed: 05/21/2018 Elsevier Patient Education  2021 [employer].

## 2020-11-16 NOTE — Progress Notes (Signed)
Subjective:    Patient ID: Ernest [name], male    DOB: [birth year], 65 y.o.   MRN: [id number] Chart reviewed CC: Annual Exam  Hypertension This is a chronic problem.    21 y male works for [employer], state was instructed in clinic that he had stage4 renal disease referred to specialist who place him on zesteril, hctz 87yr ago clinic placed him on testesterone it was d/c but his body never return to near nl level. He was referred to urology  And restarted, Psa rised cause it to be d/c. Urology biopsy 2021 of prostrate revealed no malignancy and he was restarted on meds due to fatigue  Meds aspirin EC 81 MG tablet atorvastatin (LIPITOR) 40 MG tablet chlorthalidone (HYGROTON) 25 MG tablet glipiZIDE (GLUCOTROL XL) 5 MG 24 hr tablet lisinopril (ZESTRIL) 10 MG tablet SitaGLIPtin-MetFORMIN HCl (JANUMET XR) 50-1000 MG TB24 testosterone cypionate (DEPOTESTOSTERONE CYPIONATE) 200 MG/ML injection  PMH Essential hypertension Endocrine Type 2 diabetes mellitus with stage 3 chronic kidney disease, without long-term current use of insulin (HCC) Hypogonadism in male Genitourinary Chronic kidney disease (CKD) stage G3a/A2, moderately decreased glomerular filtration rate (GFR) between 45-59 mL/min/1.73 square meter and albuminuria creatinine ratio between 30-299 mg/g biometric Class 2 severe obesity due to excess calories with serious comorbidity and body mass index (BMI) of 38.0 to 38.9 in adult [facility]) At risk for obstructive sleep apnea Other hyperlipidemia  FH brother died of IDDM, blindness      Mother died NIDDM    Review of Systems  Constitutional: Negative.   HENT: Negative.   Eyes: Negative.   Respiratory: Negative.   Cardiovascular: Negative.   Gastrointestinal: Negative.   Genitourinary: Positive for difficulty urinating.  Musculoskeletal: Negative.   Neurological: Negative.   Psychiatric/Behavioral: Negative.    Noncontributory except that of hpi    Objective:    Physical Exam Constitutional:      Appearance: Normal appearance. He is obese.  HENT:     Head: Normocephalic and atraumatic.     Right Ear: Tympanic membrane normal.     Left Ear: Tympanic membrane normal.     Nose: Nose normal.     Mouth/Throat:     Mouth: Mucous membranes are dry.     Comments: Multiple (65yr old) mercury filling Eyes:     Extraocular Movements: Extraocular movements intact.     Conjunctiva/sclera: Conjunctivae normal.     Pupils: Pupils are equal, round, and reactive to light.  Cardiovascular:     Rate and Rhythm: Normal rate and regular rhythm.     Heart sounds: Normal heart sounds.  Pulmonary:     Effort: Pulmonary effort is normal.     Comments: Pickwickian chest Abdominal:     General: Bowel sounds are normal. There is distension.     Palpations: Abdomen is soft.     Tenderness: There is no abdominal tenderness. There is no guarding or rebound.     Hernia: No hernia is present.  Genitourinary:    Testes: Normal.     Prostate: Normal.     Rectum: Normal. Guaiac result negative.  Musculoskeletal:        General: No swelling. Normal range of motion.     Cervical back: Normal range of motion and neck supple.     Right lower leg: No edema.     Comments: Pes planus, severe  Skin:    General: Skin is warm and dry.  Neurological:     General: No focal deficit present.  Mental Status: He is alert.  Psychiatric:        Mood and Affect: Mood normal.    bmi 39 truncal circ 52.5 Bp 122/84  Ekg: nsr, nl axis, marked lae, short pr interval without delta waves likely nl variant    Poor precordial progression may be lead placement in a client with pickwickian chest    There are no acute changes    Assessment & Plan:  Prostatic hypertrophy  2.2 to 3.3 interval change Urology performed sextant bx neg malignancy Previous Cts w/o nl adnexa radiologically consistent with hypertrophy. Client is dx with hypogonadism followed by Urology Hyperglycemia with  elevated cholesterol no dietary mgmt as yet done. Give protocal to address abd girth to include 30d core exist training will rtn 2m f/u  Preventive health matrix to be considered  Update colonoscope, yearly eye exam

## 2020-12-01 ENCOUNTER — Other Ambulatory Visit: Payer: Self-pay

## 2020-12-01 ENCOUNTER — Ambulatory Visit: Payer: Self-pay

## 2020-12-01 DIAGNOSIS — E291 Testicular hypofunction: Secondary | ICD-10-CM

## 2020-12-14 ENCOUNTER — Encounter: Payer: Self-pay | Admitting: Physician Assistant

## 2020-12-14 ENCOUNTER — Ambulatory Visit: Payer: Self-pay | Admitting: Physician Assistant

## 2020-12-14 ENCOUNTER — Other Ambulatory Visit: Payer: Self-pay

## 2020-12-14 VITALS — BP 134/87 | HR 92 | Temp 98.2°F | Resp 14 | Ht 70.0 in | Wt 278.0 lb

## 2020-12-14 DIAGNOSIS — E291 Testicular hypofunction: Secondary | ICD-10-CM

## 2020-12-14 MED ORDER — SAXENDA 18 MG/3ML ~~LOC~~ SOPN: 0.6000 mg | PEN_INJECTOR | Freq: Every day | SUBCUTANEOUS | 3 refills | Status: DC

## 2020-12-14 MED ORDER — METFORMIN HCL 1000 MG PO TABS: 1000.0000 mg | ORAL_TABLET | Freq: Two times a day (BID) | ORAL | 3 refills | Status: DC

## 2020-12-14 MED ORDER — TESTOSTERONE CYPIONATE 200 MG/ML IM SOLN
200.0000 mg | INTRAMUSCULAR | Status: DC
  Administered 2020-12-14 – 2021-01-26 (×4): 200 mg via INTRAMUSCULAR

## 2020-12-14 NOTE — Progress Notes (Signed)
   Subjective: Annual physical    Patient ID: Ernest [name], male    DOB: April 12, 1956, 65 y.o.   MRN: [id number]  HPI Patient presents for annual physical exam.  Patient's saw his nephrologist and wished to discuss recommendation for weight loss medication.   Review of Systems    Diabetes,Chronic kidney disease, diabetes, hyperlipidemia, hypertension, and severe obesity. Objective:   Physical Exam No acute distress.  BP is 134/87, pulse 92, respiration 1418, temperature 98.2, patient is 96% O2 sat on room air.  Patient BMI is 39.89.  HEENT is unremarkable.  Neck is supple for adenopathy or bruits.  Lungs are clear to auscultation.  Heart regular rate and rhythm.  Abdomen moderate distention secondary to body habitus, negative chest pain, normoactive bowel sounds, soft, nontender to palpation.  No obvious deformity to the upper or lower extremities.  Patient has full and equal range of motion of the upper and lower extremities.  No obvious cervical or lumbar spine deformity.  Patient has full and equal range of motion of the cervical lumbar spine.  Cranial nerves II through XII grossly intact.      Assessment & Plan: Annual exam with chronic kidney disease of morbid obesity.  Discussed lab results with patient consistent with chronic kidney disease.  Advised patient that upon the recommendation of the neurologist he will discontinue Janumet XR.  Patient will start Metformin 1000 mg twice daily.  Patient also start Saxenda.  Continue other previous medications.  Follow up in 6 weeks.

## 2020-12-29 ENCOUNTER — Ambulatory Visit: Payer: Self-pay

## 2020-12-29 ENCOUNTER — Other Ambulatory Visit: Payer: Self-pay

## 2020-12-29 DIAGNOSIS — E291 Testicular hypofunction: Secondary | ICD-10-CM

## 2021-01-11 ENCOUNTER — Other Ambulatory Visit: Payer: Self-pay

## 2021-01-11 ENCOUNTER — Ambulatory Visit: Payer: Self-pay

## 2021-01-11 DIAGNOSIS — E291 Testicular hypofunction: Secondary | ICD-10-CM

## 2021-01-16 ENCOUNTER — Other Ambulatory Visit: Payer: Self-pay | Admitting: *Deleted

## 2021-01-16 DIAGNOSIS — E291 Testicular hypofunction: Secondary | ICD-10-CM

## 2021-01-25 ENCOUNTER — Other Ambulatory Visit: Payer: Self-pay

## 2021-01-25 ENCOUNTER — Other Ambulatory Visit: Payer: 59

## 2021-01-25 DIAGNOSIS — E291 Testicular hypofunction: Secondary | ICD-10-CM | POA: Diagnosis not present

## 2021-01-26 ENCOUNTER — Telehealth: Payer: Self-pay | Admitting: *Deleted

## 2021-01-26 ENCOUNTER — Ambulatory Visit: Payer: Self-pay

## 2021-01-26 DIAGNOSIS — E291 Testicular hypofunction: Secondary | ICD-10-CM

## 2021-01-26 LAB — HEMATOCRIT: Hematocrit: 50.8 % (ref 37.5–51.0)

## 2021-01-26 LAB — TESTOSTERONE: Testosterone: 589 ng/dL (ref 264–916)

## 2021-01-26 NOTE — Telephone Encounter (Signed)
Notified patient as instructed, patient pleased. Discussed follow-up appointments, patient agrees  

## 2021-01-26 NOTE — Telephone Encounter (Signed)
-----   Message from Riki Altes, MD sent at 01/26/2021 11:44 AM EDT ----- Testosterone level is normal at 555.  Hematocrit 50.8.  Keep follow-up as scheduled.  He may want to consider a blood donation prior to next visit

## 2021-02-09 ENCOUNTER — Other Ambulatory Visit: Payer: Self-pay

## 2021-02-09 ENCOUNTER — Ambulatory Visit: Payer: Self-pay

## 2021-02-09 DIAGNOSIS — E291 Testicular hypofunction: Secondary | ICD-10-CM

## 2021-02-12 ENCOUNTER — Other Ambulatory Visit: Payer: Self-pay | Admitting: Urology

## 2021-03-02 ENCOUNTER — Ambulatory Visit: Payer: Self-pay

## 2021-03-02 ENCOUNTER — Other Ambulatory Visit: Payer: Self-pay

## 2021-03-02 DIAGNOSIS — E291 Testicular hypofunction: Secondary | ICD-10-CM

## 2021-03-02 MED ORDER — TESTOSTERONE CYPIONATE 200 MG/ML IM SOLN
200.0000 mg | INTRAMUSCULAR | Status: AC
  Administered 2021-03-02 – 2021-08-17 (×12): 200 mg via INTRAMUSCULAR

## 2021-03-02 NOTE — Addendum Note (Signed)
Addended by: Christianne Dolin F on: 03/02/2021 11:20 AM   Modules accepted: Orders

## 2021-03-13 NOTE — Progress Notes (Signed)
S/P Annual Physical 11/26/20 with Dr. Pricilla Handler Cholesteraol & Glucose elevated on labs for physical EX1 & A1c - Ernest [name] notified to fast for labs.  AMD

## 2021-03-14 ENCOUNTER — Other Ambulatory Visit: Payer: Self-pay

## 2021-03-14 DIAGNOSIS — R7309 Other abnormal glucose: Secondary | ICD-10-CM

## 2021-03-14 DIAGNOSIS — E7800 Pure hypercholesterolemia, unspecified: Secondary | ICD-10-CM

## 2021-03-15 LAB — CMP12+LP+TP+TSH+6AC+CBC/D/PLT
ALT: 16 IU/L (ref 0–44)
AST: 13 IU/L (ref 0–40)
Albumin/Globulin Ratio: 1.6 (ref 1.2–2.2)
Albumin: 4.2 g/dL (ref 3.8–4.8)
Alkaline Phosphatase: 60 IU/L (ref 44–121)
BUN/Creatinine Ratio: 14 (ref 10–24)
BUN: 25 mg/dL (ref 8–27)
Basophils Absolute: 0 10*3/uL (ref 0.0–0.2)
Basos: 0 %
Bilirubin Total: 0.4 mg/dL (ref 0.0–1.2)
Calcium: 9.7 mg/dL (ref 8.6–10.2)
Chloride: 99 mmol/L (ref 96–106)
Chol/HDL Ratio: 5.3 ratio — ABNORMAL HIGH (ref 0.0–5.0)
Cholesterol, Total: 207 mg/dL — ABNORMAL HIGH (ref 100–199)
Creatinine, Ser: 1.76 mg/dL — ABNORMAL HIGH (ref 0.76–1.27)
EOS (ABSOLUTE): 0.2 10*3/uL (ref 0.0–0.4)
Eos: 2 %
Estimated CHD Risk: 1.1 times avg. — ABNORMAL HIGH (ref 0.0–1.0)
Free Thyroxine Index: 2.5 (ref 1.2–4.9)
GGT: 28 IU/L (ref 0–65)
Globulin, Total: 2.7 g/dL (ref 1.5–4.5)
Glucose: 116 mg/dL — ABNORMAL HIGH (ref 65–99)
HDL: 39 mg/dL — ABNORMAL LOW (ref >39)
Hematocrit: 49.2 % (ref 37.5–51.0)
Hemoglobin: 15.7 g/dL (ref 13.0–17.7)
Immature Grans (Abs): 0.1 10*3/uL (ref 0.0–0.1)
Immature Granulocytes: 1 %
Iron: 49 ug/dL (ref 38–169)
LDH: 187 IU/L (ref 121–224)
LDL Chol Calc (NIH): 151 mg/dL — ABNORMAL HIGH (ref 0–99)
Lymphocytes Absolute: 1.2 10*3/uL (ref 0.7–3.1)
Lymphs: 14 %
MCH: 26.3 pg — ABNORMAL LOW (ref 26.6–33.0)
MCHC: 31.9 g/dL (ref 31.5–35.7)
MCV: 82 fL (ref 79–97)
Monocytes Absolute: 0.9 10*3/uL (ref 0.1–0.9)
Monocytes: 10 %
Neutrophils Absolute: 6.6 10*3/uL (ref 1.4–7.0)
Neutrophils: 73 %
Phosphorus: 5.1 mg/dL — ABNORMAL HIGH (ref 2.8–4.1)
Platelets: 242 10*3/uL (ref 150–450)
Potassium: 5.3 mmol/L — ABNORMAL HIGH (ref 3.5–5.2)
RBC: 5.97 x10E6/uL — ABNORMAL HIGH (ref 4.14–5.80)
RDW: 14.8 % (ref 11.6–15.4)
Sodium: 137 mmol/L (ref 134–144)
T3 Uptake Ratio: 30 % (ref 24–39)
T4, Total: 8.2 ug/dL (ref 4.5–12.0)
TSH: 1.68 u[IU]/mL (ref 0.450–4.500)
Total Protein: 6.9 g/dL (ref 6.0–8.5)
Triglycerides: 95 mg/dL (ref 0–149)
Uric Acid: 8.8 mg/dL — ABNORMAL HIGH (ref 3.8–8.4)
VLDL Cholesterol Cal: 17 mg/dL (ref 5–40)
WBC: 8.9 10*3/uL (ref 3.4–10.8)
eGFR: 43 mL/min/{1.73_m2} — ABNORMAL LOW (ref >59)

## 2021-03-15 LAB — HGB A1C W/O EAG: Hgb A1c MFr Bld: 7.8 % — ABNORMAL HIGH (ref 4.8–5.6)

## 2021-03-16 ENCOUNTER — Other Ambulatory Visit: Payer: Self-pay

## 2021-03-16 ENCOUNTER — Ambulatory Visit: Payer: Self-pay

## 2021-03-16 DIAGNOSIS — E291 Testicular hypofunction: Secondary | ICD-10-CM

## 2021-03-16 NOTE — Progress Notes (Signed)
Pt presents today for bi-weekly testosterone injection. CL,RMA 

## 2021-03-21 ENCOUNTER — Encounter: Payer: Self-pay | Admitting: Adult Health

## 2021-03-21 ENCOUNTER — Ambulatory Visit: Payer: Self-pay | Admitting: Adult Health

## 2021-03-21 ENCOUNTER — Other Ambulatory Visit: Payer: Self-pay

## 2021-03-21 VITALS — BP 131/82 | HR 93 | Temp 97.8°F | Resp 14 | Ht 70.0 in | Wt 270.0 lb

## 2021-03-21 DIAGNOSIS — G4719 Other hypersomnia: Secondary | ICD-10-CM

## 2021-03-21 DIAGNOSIS — Z6838 Body mass index (BMI) 38.0-38.9, adult: Secondary | ICD-10-CM

## 2021-03-21 DIAGNOSIS — E1122 Type 2 diabetes mellitus with diabetic chronic kidney disease: Secondary | ICD-10-CM

## 2021-03-21 DIAGNOSIS — N1831 Chronic kidney disease, stage 3a: Secondary | ICD-10-CM

## 2021-03-21 MED ORDER — VICTOZA 18 MG/3ML ~~LOC~~ SOPN: PEN_INJECTOR | SUBCUTANEOUS | 3 refills | Status: DC

## 2021-03-21 NOTE — Progress Notes (Signed)
Pt presents today for 3 month results. CL,RMA

## 2021-03-21 NOTE — Progress Notes (Signed)
[facility] [address] [place], [place] [address]   Office Visit Note  Patient Name: Ernest [name]  [id number]  [id number]  Date of Service: 03/21/2021  Chief Complaint  Patient presents with   Follow-up     HPI Pt is here for follow up.  He had labs drawn on 03/14/2021 and is here today for follow up. Previously his medications have been changed.  Nephrology wanted him to stop Janumet, and start saxsenda to aid with weight loss.  Insurance did not approve of medication. His A1C is unchanged at 7.8 today.  He reports no low, or high blood sugars.   He also complains of excessive daytime fatigue, loud snoring, coughing and headaches in the morning. He was told he needed a sleep study  few years ago, but the copay was too much for him to afford. He continues to have these symptoms, and given that his BMI is 38, Its necessary to evaluate this.  Will reorder at this time.      Current Medication:  Outpatient Encounter Medications as of 03/21/2021  Medication Sig   aspirin EC 81 MG tablet Take by mouth.   atorvastatin (LIPITOR) 40 MG tablet TAKE 1 TABLET BY MOUTH ONCE DAILY. DISCONTINUE ATORVASTATIN 20MG .   chlorthalidone (HYGROTON) 25 MG tablet Take 1/2 tablet by mouth every morning   glipiZIDE (GLUCOTROL XL) 5 MG 24 hr tablet Take 1 tablet (5 mg total) by mouth daily.   liraglutide (VICTOZA) 18 MG/3ML SOPN Start 0.6mg  SQ once a day for 7 days, then increase to 1.2mg  once a day   lisinopril (ZESTRIL) 10 MG tablet Take 1 tablet (10 mg total) by mouth daily.   metFORMIN (GLUCOPHAGE) 1000 MG tablet Take 1 tablet (1,000 mg total) by mouth 2 (two) times daily with a meal.   testosterone cypionate (DEPOTESTOSTERONE CYPIONATE) 200 MG/ML injection INJECT 1 ML INTO THE MUSCLE EVERY 14 DAYS   Vit C-Quercet-Bioflv-Bromelain 450-250-125-50 MG CAPS Take 2 Doses by mouth 3 (three) times daily between meals.   [DISCONTINUED] Liraglutide -Weight Management (SAXENDA) 18 MG/3ML SOPN Inject  0.6 mg into the skin daily. Increased dosage weekly by 0.6mg , to reach max of 3.0 mg daily by week 5   Facility-Administered Encounter Medications as of 03/21/2021  Medication   testosterone cypionate (DEPOTESTOSTERONE CYPIONATE) injection 200 mg      Medical History: Past Medical History:  Diagnosis Date   CKD (chronic kidney disease)    Colon polyps    Diabetes mellitus without complication (HCC)    Elevated lipids    Flat feet    Gout    Hyperkalemia    Hypertension    Over weight      Vital Signs: BP 131/82   Pulse 93   Temp 97.8 F (36.6 C)   Resp 14   Ht 5\' 10"  (1.778 m)   Wt 270 lb (122.5 kg)   SpO2 97%   BMI 38.74 kg/m    Review of Systems  Constitutional:  Negative for activity change, appetite change and fatigue.  HENT:  Negative for congestion, sinus pain, trouble swallowing and voice change.   Eyes:  Negative for pain, discharge and visual disturbance.  Respiratory:  Negative for cough, chest tightness and shortness of breath.   Cardiovascular:  Negative for chest pain and leg swelling.  Gastrointestinal:  Negative for abdominal distention, abdominal pain, constipation and diarrhea.  Musculoskeletal:  Negative for arthralgias, back pain and neck pain.  Skin:  Negative for color change.  Neurological:  Negative for dizziness, weakness and headaches.  Hematological:  Negative for adenopathy.  Psychiatric/Behavioral:  Negative for agitation, confusion and suicidal ideas.    Physical Exam Constitutional:      Appearance: Normal appearance. He is normal weight.  HENT:     Head: Normocephalic.     Right Ear: Tympanic membrane normal.     Left Ear: Tympanic membrane normal.     Nose: Nose normal.     Mouth/Throat:     Mouth: Mucous membranes are moist.     Pharynx: No oropharyngeal exudate or posterior oropharyngeal erythema.  Eyes:     Extraocular Movements: Extraocular movements intact.     Pupils: Pupils are equal, round, and reactive to light.   Cardiovascular:     Rate and Rhythm: Normal rate and regular rhythm.     Pulses: Normal pulses.     Heart sounds: Normal heart sounds. No murmur heard. Pulmonary:     Effort: Pulmonary effort is normal. No respiratory distress.     Breath sounds: Normal breath sounds. No wheezing or rhonchi.  Musculoskeletal:        General: No swelling or deformity. Normal range of motion.     Cervical back: Normal range of motion.  Skin:    General: Skin is warm and dry.     Capillary Refill: Capillary refill takes less than 2 seconds.  Neurological:     General: No focal deficit present.     Mental Status: He is alert.     Cranial Nerves: No cranial nerve deficit.     Gait: Gait normal.  Psychiatric:        Mood and Affect: Mood normal.        Behavior: Behavior normal.        Judgment: Judgment normal.    Assessment/Plan: 1. Type 2 diabetes mellitus with stage 3a chronic kidney disease, without long-term current use of insulin (HCC) Stop Janumet, start taking metformin as prescribed.  And start Vicotza 0.6 mg Sub Cut, daily x 1 week.  If no side effects or issues, may increase to 1.2mg  Sub cut daily, until next appointment.  - liraglutide (VICTOZA) 18 MG/3ML SOPN; Start 0.6mg  SQ once a day for 7 days, then increase to 1.2mg  once a day  Dispense: 6 mL; Refill: 3  2. Chronic kidney disease (CKD) stage G3a/A2, moderately decreased glomerular filtration rate (GFR) between 45-59 mL/min/1.73 square meter and albuminuria creatinine ratio between 30-299 mg/g Follow up with Nephrology next week as planned.  3. Excessive daytime sleepiness Sleep study ordered through [facility], faxed order.   4. Class 2 severe obesity due to excess calories with serious comorbidity and body mass index (BMI) of 38.0 to 38.9 in adult [facility]) Continue with diet and exercise as discussed.  Obesity Counseling: Risk Assessment: An assessment of behavioral risk factors was made today and includes lack of exercise  sedentary lifestyle, lack of portion control and poor dietary habits.  Risk Modification Advice: She was counseled on portion control guidelines. Restricting daily caloric intake to 1800. The detrimental long term effects of obesity on her health and ongoing poor compliance was also discussed with the patient.      General Counseling: Gurkirat verbalizes understanding of the findings of todays visit and agrees with plan of treatment. I have discussed any further diagnostic evaluation that may be needed or ordered today. We also reviewed his medications today. he has been encouraged to call the office with any questions or concerns that should arise  related to todays visit.   No orders of the defined types were placed in this encounter.   Meds ordered this encounter  Medications   liraglutide (VICTOZA) 18 MG/3ML SOPN    Sig: Start 0.6mg  SQ once a day for 7 days, then increase to 1.2mg  once a day    Dispense:  6 mL    Refill:  3    Time spent:30 Minutes    Johnna Acosta AGNP-C Nurse Practitioner

## 2021-03-25 ENCOUNTER — Other Ambulatory Visit: Payer: Self-pay | Admitting: Physician Assistant

## 2021-03-25 DIAGNOSIS — E1122 Type 2 diabetes mellitus with diabetic chronic kidney disease: Secondary | ICD-10-CM

## 2021-03-30 ENCOUNTER — Other Ambulatory Visit: Payer: Self-pay

## 2021-03-30 ENCOUNTER — Ambulatory Visit: Payer: Self-pay

## 2021-03-30 DIAGNOSIS — E291 Testicular hypofunction: Secondary | ICD-10-CM

## 2021-04-03 ENCOUNTER — Other Ambulatory Visit: Payer: Self-pay

## 2021-04-03 NOTE — Telephone Encounter (Signed)
Rx refill request already in Epic - sent electronically by patient's pharmacy.  Re-routed to Durward Parcel, PA-C.  AMD

## 2021-04-04 ENCOUNTER — Other Ambulatory Visit: Payer: Self-pay | Admitting: Physician Assistant

## 2021-04-04 ENCOUNTER — Encounter (INDEPENDENT_AMBULATORY_CARE_PROVIDER_SITE_OTHER): Payer: 59 | Admitting: Internal Medicine

## 2021-04-04 DIAGNOSIS — N1831 Chronic kidney disease, stage 3a: Secondary | ICD-10-CM

## 2021-04-04 DIAGNOSIS — G4719 Other hypersomnia: Secondary | ICD-10-CM

## 2021-04-04 DIAGNOSIS — G4733 Obstructive sleep apnea (adult) (pediatric): Secondary | ICD-10-CM

## 2021-04-04 DIAGNOSIS — E1122 Type 2 diabetes mellitus with diabetic chronic kidney disease: Secondary | ICD-10-CM

## 2021-04-09 DIAGNOSIS — G4719 Other hypersomnia: Secondary | ICD-10-CM | POA: Insufficient documentation

## 2021-04-09 DIAGNOSIS — G4733 Obstructive sleep apnea (adult) (pediatric): Secondary | ICD-10-CM | POA: Insufficient documentation

## 2021-04-09 NOTE — Procedures (Signed)
[facility]  Polysomnogram Report Part I                                                                 Phone: [phone] Fax: [phone]  Patient Name: Ernest Ernest [name], Ernest [name] Acquisition Number: [id number]  Date of Birth: 08/12/1956 Acquisition Date: 04/04/2021  Referring Physician: Blima Ledger, NP     History: The patient is a 65 year old male who was referred for evaluation of possible sleep apnea with snoring and sleepiness. Medical History: Chronic kidney disease, diabetes mellitus, hypertension, high cholesterol, gout, obesity.  Medications: aspirin, atorvastatin, chlorthalidone, glipizide, liraglutide, lisinopril, metformin, testosterone.  Procedure: This routine overnight polysomnogram was performed on the [facility] 5 using the standard diagnostic protocol. This included 6 channels of EEG, 2 channels of EOG, chin EMG, bilateral anterior tibialis EMG, nasal/oral thermistor, PTAF (nasal pressure transducer), chest and abdominal wall movements, EKG, and pulse oximetry.  Description: The total recording time was 425.6 minutes. The total sleep time was 339.4 minutes. There were a total of 74.0 minutes of wakefulness after sleep onset for areducedsleep efficiency of 79.7%. The latency to sleep onset was within normal limitsat 12.2 minutes. The R sleep onset latency was prolonged at 242.0 minutes. Sleep parameters, as a percentage of the total sleep time, demonstrated 14.4% of sleep was in N1 sleep, 83.1% N2, 0.0% N3 and 2.5% R sleep. There were a total of 92 arousals for an arousal index of 16.3 arousals per hour of sleep that was elevated.  Respiratory monitoring demonstrated  no significant snoring in any position. There were 91 apneas and hypopneas for an Apnea Hypopnea Index of 16.1 apneas and hypopneas per hour of sleep. The REM related apnea hypopnea index was 35.3/hr of REM sleep compared to a NREM AHI of 15.6/hr. The Respiratory Disturbance Index, which includes 23 respiratory  effort related arousals (RERAs), was 20.2 respiratory events per hour of sleep.  The average duration of the respiratory events was 37.1 seconds with a maximum duration of 80.5 seconds. The respiratory events occurred in all position. The respiratory events were associated with peripheral oxygen desaturations on the average to 93%. The lowest oxygen desaturation associated with a respiratory event was 85%. Additionally, the baseline oxygen saturation during wakefulness was 97%, during NREM sleep averaged 96%, and during REM sleep averaged  96%. The total duration of oxygen < 90% was 0.9 minutes.  Cardiac monitoring- did not demonstrate transient cardiac decelerations associated with the apneas. There were no significant cardiac rhythm irregularities.   Periodic limb movement monitoring- demonstrated that there were 25 periodic limb movements for a periodic limb movement index of 4.4 periodic limb movements per hour of sleep. Leg tremors and quasi-periodic limb movements were observed during periods of wakefulness.  Impression: This routine overnight polysomnogram demonstrated significant obstructive sleep apnea with an overall Apnea Hypopnea Index of 16.1 apneas and hypopneas per hour of sleep. The apnea was more frequent in REM sleep with an AHI of 35.3, however REM sleep time was very limited and the findings my underestimate the severity of the sleep apnea.  There were few periodic limb movements that commonly are not significant. Leg tremors and quasi-periodic limb movements were observed during periods of wakefulness. Clinical correlation would be suggested.   There was  a reduced sleep efficiency with an elevated arousal index,increased awakenings, a reduced REM percentage and no slow wave sleep. These findings would appear to be due to the obstructive sleep apnea.  Recommendations:    A CPAP titration would be recommended due to the severity of the sleep apnea. Additionally, would recommend  weight loss in a patient with a BMI of 38.9.     Yevonne Pax, MD, [facility] Diplomate ABMS-Pulmonary, Critical Care and Sleep Medicine  Electronically reviewed and digitally signed      [facility] Polysomnogram Report Part II  Phone: [phone] Fax: [phone]  Patient last name Ernest [name] Neck Size 16.0 in. Acquisition [id number]  Patient first name Ernest Ernest [name] Weight 271.0 lbs. Started 04/04/2021 at 10:22:51 PM  Birth date Sep 26, 1956 Height 70.0 in. Stopped 04/05/2021 at 5:37:15 AM  Age 82 BMI 38.9 lb/in2 Duration 425.6  Study Type Adult      Rockie Neighbours, RPSGT Sleep Data: Lights Out: 10:28:39 PM Sleep Onset: 10:40:51 PM  Lights On: 5:34:15 AM Sleep Efficiency: 79.7 %  Total Recording Time: 425.6 min Sleep Latency (from Lights Off) 12.2 min  Total Sleep Time (TST): 339.4 min R Latency (from Sleep Onset): 242.0 min  Sleep Period Time: 413.4 min Total number of awakenings: 25  Wake during sleep: 74.0 min Wake After Sleep Onset (WASO): 74.0 min   Sleep Data:         Arousal Summary: Stage  Latency from lights out (min) Latency from sleep onset (min) Duration (min) % Total Sleep Time  Normal values  N 1 12.2 0.0 49.0 14.4 (5%)  N 2 14.2 2.0 281.9 83.1 (50%)  N 3       0.0 0.0 (20%)  R 254.2 242.0 8.5 2.5 (25%)    Number Index  Spontaneous 46 8.1  Apneas & Hypopneas 40 7.1  RERAs 23 4.1       (Apneas & Hypopneas & RERAs)  (63) (11.1)  Limb Movement 6 1.1  Snore 0 0.0  TOTAL 115 20.3     Respiratory Data:  CA OA MA Apnea Hypopnea* A+ H RERA Total  Number 0 15 0 15 76 91 23 114  Mean Dur (sec) 0.0 15.6 0.0 15.6 40.3 36.3 40.5 37.1  Max Dur (sec) 0.0 20.5 0.0 20.5 80.5 80.5 73.5 80.5  Total Dur (min) 0.0 3.9 0.0 3.9 51.1 55.0 15.5 70.5  % of TST 0.0 1.2 0.0 1.2 15.1 16.2 4.6 20.8  Index (#/h TST) 0.0 2.7 0.0 2.7 13.4 16.1 4.1 20.2  *Hypopneas scored based on 4% or greater desaturation.  Sleep Stage:        REM NREM TST  AHI 35.3 15.6 16.1  RDI 35.3 19.8 20.2            Body Position Data:  Sleep (min) TST (%) REM (min) NREM (min) CA (#) OA (#) MA (#) HYP (#) AHI (#/h) RERA (#) RDI (#/h) Desat (#)  Supine 257.1 75.75 0.0 257.1 0 7 0 58 15.2 23 20.5 128  Non-Supine 82.30 24.25 8.50 73.80 0.00 8.00 0.00 18.00 18.96 0 18.96 41.00  Left: 79.9 23.54 8.5 71.4 0 8 0 18 19.5 0 19.5 40  Right: 2.4 0.71 0.0 2.4 0 0 0 0 0.0 0 0.00 1     Snoring: Total number of snoring episodes  0  Total time with snoring    min (   % of sleep)   Oximetry Distribution:  WK REM NREM TOTAL  Average (%)   97 96 96 96  < 90% 0.2 0.1 0.6 0.9  < 80% 0.1 0.0 0.0 0.1  < 70% 0.1 0.0 0.0 0.1  # of Desaturations* 3 8 158 169  Desat Index (#/hour) 2.2 56.5 28.6 29.9  Desat Max (%) 4 9 13 13   Desat Max Dur (sec) 30.0 52.0 92.0 92.0  Approx Min O2 during sleep 85  Approx min O2 during a respiratory event 85  Was Oxygen added (Y/N) and final rate No:   0 LPM  *Desaturations based on 3% or greater drop from baseline.   Cheyne Stokes Breathing: None Present    Heart Rate Summary:  Average Heart Rate During Sleep 82.9 bpm      Highest Heart Rate During Sleep (95th %) 88.0 bpm      Highest Heart Rate During Sleep 101 bpm      Highest Heart Rate During Recording (TIB) 127 bpm       Heart Rate Observations: Event Type # Events   Bradycardia 0 Lowest HR Scored: N/A  Sinus Tachycardia During Sleep 0 Highest HR Scored: N/A  Narrow Complex Tachycardia 0 Highest HR Scored: N/A  Wide Complex Tachycardia 0 Highest HR Scored: N/A  Asystole 0 Longest Pause: N/A  Atrial Fibrillation 0 Duration Longest Event: N/A  Other Arrythmias  No Type:    Periodic Limb Movement Data: (Primary legs unless otherwise noted) Total # Limb Movement 27 Limb Movement Index 4.8  Total # PLMS 25 PLMS Index 4.4  Total # PLMS Arousals 6 PLMS Arousal Index 1.1  Percentage Sleep Time with PLMS 13.73min (4.0 % sleep)  Mean Duration limb movements (secs) 134.7

## 2021-04-11 ENCOUNTER — Encounter: Payer: Self-pay | Admitting: Physician Assistant

## 2021-04-11 ENCOUNTER — Ambulatory Visit: Payer: Self-pay

## 2021-04-11 ENCOUNTER — Ambulatory Visit: Payer: Self-pay | Admitting: Physician Assistant

## 2021-04-11 ENCOUNTER — Other Ambulatory Visit: Payer: Self-pay

## 2021-04-11 VITALS — BP 123/82 | HR 90 | Temp 97.8°F | Resp 14 | Ht 70.0 in | Wt 270.0 lb

## 2021-04-11 DIAGNOSIS — E1122 Type 2 diabetes mellitus with diabetic chronic kidney disease: Secondary | ICD-10-CM

## 2021-04-11 DIAGNOSIS — N1831 Chronic kidney disease, stage 3a: Secondary | ICD-10-CM

## 2021-04-11 DIAGNOSIS — G4733 Obstructive sleep apnea (adult) (pediatric): Secondary | ICD-10-CM

## 2021-04-11 MED ORDER — GLUCOSE BLOOD VI STRP: ORAL_STRIP | 12 refills | Status: DC

## 2021-04-11 NOTE — Progress Notes (Signed)
   Subjective: Diabetes and sleep apnea    Patient ID: Ernest [name], male    DOB: 1956/09/08, 65 y.o.   MRN: [id number]  HPI Patient follow-up status post sleep study.  Patient state he has not been made aware of results of his sleep study.  Patient also states he has noticed improvement of blood glucose status post starting Victoza added to his Glucotrol and metformin.  Patient states he started a new diet program.   Review of Systems Diabetes, hyperlipidemia, hypertension, renal disease, and OSA.    Objective:   Physical Exam No acute distress.  Temperature 97.8, pulse 90, respiration 14, BP is 123/82, and patient 97% O2 sat on room air.  Patient weighs 270 pounds.  BMI is 30.7. Reviewed lab results showing hemoglobin A1c 7.8.  No change in 6 months.       Assessment & Plan: Diabetes and sleep apnea.   Discussed findings of sleep study with patient consistent with sleep apnea.  [facility] has recommended CPAP titration and will schedule patient in the near future.  Patient advised to continue previous medications and follow-up in 3 months.

## 2021-04-11 NOTE — Progress Notes (Signed)
Pt presents today for 3 week follow up. CL,RMA

## 2021-04-13 ENCOUNTER — Ambulatory Visit: Payer: 59

## 2021-04-24 DIAGNOSIS — Z6838 Body mass index (BMI) 38.0-38.9, adult: Secondary | ICD-10-CM | POA: Diagnosis not present

## 2021-04-24 DIAGNOSIS — N1831 Chronic kidney disease, stage 3a: Secondary | ICD-10-CM | POA: Diagnosis not present

## 2021-04-24 DIAGNOSIS — E875 Hyperkalemia: Secondary | ICD-10-CM | POA: Diagnosis not present

## 2021-04-24 DIAGNOSIS — I10 Essential (primary) hypertension: Secondary | ICD-10-CM | POA: Diagnosis not present

## 2021-04-26 ENCOUNTER — Other Ambulatory Visit: Payer: Self-pay

## 2021-04-26 ENCOUNTER — Other Ambulatory Visit: Payer: Self-pay | Admitting: Physician Assistant

## 2021-04-26 DIAGNOSIS — N1831 Chronic kidney disease, stage 3a: Secondary | ICD-10-CM

## 2021-04-26 MED ORDER — VICTOZA 18 MG/3ML ~~LOC~~ SOPN: PEN_INJECTOR | SUBCUTANEOUS | 3 refills | Status: DC

## 2021-04-27 ENCOUNTER — Other Ambulatory Visit: Payer: Self-pay

## 2021-04-27 ENCOUNTER — Ambulatory Visit: Payer: Self-pay

## 2021-04-27 DIAGNOSIS — E291 Testicular hypofunction: Secondary | ICD-10-CM

## 2021-04-27 NOTE — Progress Notes (Signed)
Pt received bi weekly testosterone injection. CL,RMA 

## 2021-05-01 DIAGNOSIS — G4733 Obstructive sleep apnea (adult) (pediatric): Secondary | ICD-10-CM | POA: Diagnosis not present

## 2021-05-11 ENCOUNTER — Other Ambulatory Visit: Payer: Self-pay

## 2021-05-11 ENCOUNTER — Ambulatory Visit: Payer: Self-pay

## 2021-05-11 DIAGNOSIS — E291 Testicular hypofunction: Secondary | ICD-10-CM

## 2021-05-11 NOTE — Progress Notes (Signed)
Pt received bi-weekly T-Injection. CL,RMA

## 2021-05-15 ENCOUNTER — Ambulatory Visit (INDEPENDENT_AMBULATORY_CARE_PROVIDER_SITE_OTHER): Payer: 59 | Admitting: Podiatry

## 2021-05-15 ENCOUNTER — Encounter: Payer: Self-pay | Admitting: Podiatry

## 2021-05-15 ENCOUNTER — Other Ambulatory Visit: Payer: Self-pay

## 2021-05-15 DIAGNOSIS — M19079 Primary osteoarthritis, unspecified ankle and foot: Secondary | ICD-10-CM

## 2021-05-15 DIAGNOSIS — M19071 Primary osteoarthritis, right ankle and foot: Secondary | ICD-10-CM

## 2021-05-15 NOTE — Progress Notes (Signed)
Subjective:  Patient ID: Kermitt Enrigue Catena, male    DOB: Jan 06, 1956,  MRN: [id number]  Chief Complaint  Patient presents with   Foot Pain    Left foot pain PT stated that at times he will get a knot that comes up on the lateral side of his foot that causes him some discomfort he stated that it is not constant and it will come and go     65 y.o. male presents with the above complaint.  Patient presents with complaint of left lateral midfoot pain.  Patient states that it hurts when he is ambulating.  He states a little knot pops up and has gets to be very painful and discomfort.  He is constantly on his foot and still toe boots.  Does not happen all the time and happens about every 2 weeks.  He has not seen anyone else prior to seeing me.  He denies any other acute complaint he has not tried any treatment options he is a diabetic with last A1c of 7.8.   Review of Systems: Negative except as noted in the HPI. Denies N/V/F/Ch.  Past Medical History:  Diagnosis Date   CKD (chronic kidney disease)    Colon polyps    Diabetes mellitus without complication (HCC)    Elevated lipids    Flat feet    Gout    Hyperkalemia    Hypertension    Over weight     Current Outpatient Medications:    aspirin EC 81 MG tablet, Take by mouth., Disp: , Rfl:    atorvastatin (LIPITOR) 40 MG tablet, TAKE 1 TABLET BY MOUTH ONCE DAILY. DISCONTINUE ATORVASTATIN 20MG ., Disp: , Rfl:    chlorthalidone (HYGROTON) 25 MG tablet, Take 1/2 tablet by mouth every morning, Disp: 45 tablet, Rfl: 3   glipiZIDE (GLUCOTROL XL) 5 MG 24 hr tablet, Take 1 tablet (5 mg total) by mouth daily., Disp: 90 tablet, Rfl: 2   glucose blood test strip, Use as instructed, Disp: 100 each, Rfl: 12   liraglutide (VICTOZA) 18 MG/3ML SOPN, 1.2mg  once a day, Disp: 6 mL, Rfl: 3   liraglutide (VICTOZA) 18 MG/3ML SOPN, 1.2 mg SQ daily, Disp: 6 mL, Rfl: 3   lisinopril (ZESTRIL) 10 MG tablet, Take 1 tablet (10 mg total) by mouth daily., Disp: 90 tablet,  Rfl: 1   metFORMIN (GLUCOPHAGE) 1000 MG tablet, Take 1 tablet (1,000 mg total) by mouth 2 (two) times daily with a meal., Disp: 180 tablet, Rfl: 3   testosterone cypionate (DEPOTESTOSTERONE CYPIONATE) 200 MG/ML injection, INJECT 1 ML INTO THE MUSCLE EVERY 14 DAYS, Disp: 10 mL, Rfl: 0  Current Facility-Administered Medications:    testosterone cypionate (DEPOTESTOSTERONE CYPIONATE) injection 200 mg, 200 mg, Intramuscular, Q14 Days, , PA-C, 200 mg at 05/11/21 1106  Social History   Tobacco Use  Smoking Status Never  Smokeless Tobacco Never    No Known Allergies Objective:  There were no vitals filed for this visit. There is no height or weight on file to calculate BMI. Constitutional Well developed. Well nourished.  Vascular Dorsalis pedis pulses palpable bilaterally. Posterior tibial pulses palpable bilaterally. Capillary refill normal to all digits.  No cyanosis or clubbing noted. Pedal hair growth normal.  Neurologic Normal speech. Oriented to person, place, and time. Epicritic sensation to light touch grossly present bilaterally.  Dermatologic Nails well groomed and normal in appearance. No open wounds. No skin lesions.  Orthopedic: Pain on palpation left lateral midfoot.  Pain at the fourth tarsometatarsal joint.  Clinically I am able to palpate superficial osteophytes secondary to underlying osteoarthritis.  Pain on palpation   Radiographs: None Assessment:   1. Arthritis of midfoot    Plan:  Patient was evaluated and treated and all questions answered.  Right midfoot arthritis -I explained to the patient the etiology of arthritis and various treatment options were discussed.  I discussed with him that constant leave wearing steel toe boots causes rubbing against the lateral part of the foot that could irritate the underlying arthritis secondary to cyst formation with pain.  Patient states understanding will proceed with shoe gear modification.  Given the  amount of pain that is having I believe will benefit from steroid injection help decrease acute inflammatory component associated pain.  Patient agrees with plan like to proceed with a steroid injection -A steroid injection was performed at Right lateral midfoot using 1% plain Lidocaine and 10 mg of Kenalog. This was well tolerated.   No follow-ups on file.

## 2021-05-25 ENCOUNTER — Other Ambulatory Visit: Payer: Self-pay

## 2021-05-25 ENCOUNTER — Ambulatory Visit: Payer: Self-pay

## 2021-05-25 DIAGNOSIS — E291 Testicular hypofunction: Secondary | ICD-10-CM

## 2021-05-25 NOTE — Progress Notes (Signed)
Pt presents today for bi-weekly T injection. CL,RMA 

## 2021-06-01 DIAGNOSIS — G4733 Obstructive sleep apnea (adult) (pediatric): Secondary | ICD-10-CM | POA: Diagnosis not present

## 2021-06-08 ENCOUNTER — Other Ambulatory Visit: Payer: Self-pay

## 2021-06-08 ENCOUNTER — Ambulatory Visit: Payer: Self-pay

## 2021-06-08 DIAGNOSIS — E291 Testicular hypofunction: Secondary | ICD-10-CM

## 2021-06-08 NOTE — Progress Notes (Signed)
Pt received bi-weekly injection. 

## 2021-06-14 ENCOUNTER — Ambulatory Visit: Payer: 59 | Admitting: Podiatry

## 2021-06-22 ENCOUNTER — Ambulatory Visit: Payer: Self-pay

## 2021-06-22 ENCOUNTER — Other Ambulatory Visit: Payer: Self-pay

## 2021-06-22 DIAGNOSIS — E291 Testicular hypofunction: Secondary | ICD-10-CM

## 2021-06-22 NOTE — Progress Notes (Signed)
Bi weekly t injection./CL,RMA

## 2021-07-01 DIAGNOSIS — G4733 Obstructive sleep apnea (adult) (pediatric): Secondary | ICD-10-CM | POA: Diagnosis not present

## 2021-07-06 ENCOUNTER — Ambulatory Visit: Payer: Self-pay

## 2021-07-06 ENCOUNTER — Other Ambulatory Visit: Payer: Self-pay

## 2021-07-06 ENCOUNTER — Other Ambulatory Visit: Payer: Self-pay | Admitting: Urology

## 2021-07-06 DIAGNOSIS — E291 Testicular hypofunction: Secondary | ICD-10-CM

## 2021-07-06 NOTE — Progress Notes (Signed)
Pt received bi weekly testosterone injection. CL,RMA 

## 2021-07-12 ENCOUNTER — Other Ambulatory Visit: Payer: Self-pay

## 2021-07-12 DIAGNOSIS — E1122 Type 2 diabetes mellitus with diabetic chronic kidney disease: Secondary | ICD-10-CM

## 2021-07-12 DIAGNOSIS — N1831 Chronic kidney disease, stage 3a: Secondary | ICD-10-CM

## 2021-07-13 LAB — BUN+CREAT
BUN/Creatinine Ratio: 11 (ref 10–24)
BUN: 19 mg/dL (ref 8–27)
Creatinine, Ser: 1.71 mg/dL — ABNORMAL HIGH (ref 0.76–1.27)
eGFR: 44 mL/min/{1.73_m2} — ABNORMAL LOW (ref >59)

## 2021-07-13 LAB — HGB A1C W/O EAG: Hgb A1c MFr Bld: 6.3 % — ABNORMAL HIGH (ref 4.8–5.6)

## 2021-07-18 ENCOUNTER — Encounter: Payer: Self-pay | Admitting: Physician Assistant

## 2021-07-18 ENCOUNTER — Other Ambulatory Visit: Payer: Self-pay

## 2021-07-18 ENCOUNTER — Ambulatory Visit: Payer: Self-pay | Admitting: Physician Assistant

## 2021-07-18 VITALS — BP 106/74 | HR 89 | Temp 97.0°F | Resp 14 | Ht 70.0 in | Wt 250.0 lb

## 2021-07-18 DIAGNOSIS — G4733 Obstructive sleep apnea (adult) (pediatric): Secondary | ICD-10-CM

## 2021-07-18 DIAGNOSIS — E1122 Type 2 diabetes mellitus with diabetic chronic kidney disease: Secondary | ICD-10-CM

## 2021-07-18 DIAGNOSIS — N1831 Chronic kidney disease, stage 3a: Secondary | ICD-10-CM

## 2021-07-18 NOTE — Progress Notes (Signed)
Pt presents today for 3 month lab follow up./CL,RMA

## 2021-07-18 NOTE — Progress Notes (Signed)
   Subjective: Diabetes    Patient ID: Ernest [name], male    DOB: 1956-05-13, 65 y.o.   MRN: [id number]  HPI Patient 40-month follow-up for diabetes and sleep apnea.  Patient state in the last 6 months he is lost approximately 20 pounds and noticed that his blood sugars have improved with a combination of medication consisting of metformin, Glucotrol, and Victoza.  Patient also states he is now using CPAP for sleep apnea.  Patient was concerned for dry mouth secondary to using the CPAP machine.   Review of Systems Diabetes, hyperlipidemia, hypertension.    Objective:   Physical Exam No acute distress.  See nurses notes for vital signs.  Hemoglobin A1c has decreased from 7.8 to 6.3. HEENT is unremarkable.  Neck is supple.  Lungs clear to auscultation.  Heart regular rate and rhythm.       Assessment & Plan: Diabetes and sleep apnea.   Discussed lab results with patient.  Patient has a follow-up endocrinology next week.  Discussed CPAP option with patient.  Continue previous medications and follow-up in 6 months.

## 2021-07-20 ENCOUNTER — Other Ambulatory Visit: Payer: Self-pay

## 2021-07-20 ENCOUNTER — Ambulatory Visit: Payer: Self-pay

## 2021-07-20 DIAGNOSIS — E291 Testicular hypofunction: Secondary | ICD-10-CM

## 2021-07-20 NOTE — Progress Notes (Signed)
Pt presents today for bi-weekly injection.  

## 2021-07-23 ENCOUNTER — Other Ambulatory Visit: Payer: Self-pay

## 2021-07-23 DIAGNOSIS — R972 Elevated prostate specific antigen [PSA]: Secondary | ICD-10-CM

## 2021-07-23 DIAGNOSIS — E291 Testicular hypofunction: Secondary | ICD-10-CM

## 2021-07-27 ENCOUNTER — Other Ambulatory Visit: Payer: 59

## 2021-07-27 ENCOUNTER — Other Ambulatory Visit: Payer: Self-pay

## 2021-07-27 DIAGNOSIS — R972 Elevated prostate specific antigen [PSA]: Secondary | ICD-10-CM | POA: Diagnosis not present

## 2021-07-27 DIAGNOSIS — E291 Testicular hypofunction: Secondary | ICD-10-CM | POA: Diagnosis not present

## 2021-07-28 LAB — TESTOSTERONE: Testosterone: 948 ng/dL — ABNORMAL HIGH (ref 264–916)

## 2021-07-28 LAB — HEMATOCRIT: Hematocrit: 46.8 % (ref 37.5–51.0)

## 2021-07-28 LAB — PSA: Prostate Specific Ag, Serum: 5.4 ng/mL — ABNORMAL HIGH (ref 0.0–4.0)

## 2021-07-30 ENCOUNTER — Ambulatory Visit: Payer: Self-pay | Admitting: Urology

## 2021-08-01 ENCOUNTER — Encounter: Payer: Self-pay | Admitting: Urology

## 2021-08-01 ENCOUNTER — Ambulatory Visit (INDEPENDENT_AMBULATORY_CARE_PROVIDER_SITE_OTHER): Payer: 59 | Admitting: Urology

## 2021-08-01 ENCOUNTER — Other Ambulatory Visit: Payer: Self-pay

## 2021-08-01 VITALS — BP 116/72 | HR 98 | Ht 70.0 in | Wt 249.0 lb

## 2021-08-01 DIAGNOSIS — R972 Elevated prostate specific antigen [PSA]: Secondary | ICD-10-CM

## 2021-08-01 DIAGNOSIS — E291 Testicular hypofunction: Secondary | ICD-10-CM | POA: Diagnosis not present

## 2021-08-01 DIAGNOSIS — G4733 Obstructive sleep apnea (adult) (pediatric): Secondary | ICD-10-CM | POA: Diagnosis not present

## 2021-08-01 NOTE — Progress Notes (Signed)
08/01/2021 7:21 AM   Ernest [name] [birth year] [id number]  Referring provider: No referring provider defined for this encounter.  Chief Complaint  Patient presents with   Hypogonadism    Urologic history:  1.  Hypogonadism Symptoms tiredness, fatigue and decreased libido TRT testosterone cypionate 200 mg every 2 weeks with symptom improvement  2.  Abnormal PSA velocity Prostate biopsy 10/20/2020 PSA 3.3 70 g prostate; benign pathology   HPI: 65 y.o. male presents for annual follow-up.  Doing well since last visit No bothersome LUTS Denies dysuria, gross hematuria Denies flank, abdominal or pelvic pain Remains on TRT with good energy and libido 83-month interim labs looked good; hematocrit was 50.8 Labs 07/27/2021: Testosterone 948 ng/dL, PSA 5.4, hematocrit 46.8 Last injection at time of lab visit was 1 week prior   PMH: Past Medical History:  Diagnosis Date   CKD (chronic kidney disease)    Colon polyps    Diabetes mellitus without complication (HCC)    Elevated lipids    Flat feet    Gout    Hyperkalemia    Hypertension    Over weight     Surgical History: Past Surgical History:  Procedure Laterality Date   COLONOSCOPY      Home Medications:  Allergies as of 08/01/2021   No Known Allergies      Medication List        Accurate as of August 01, 2021 11:59 PM. If you have any questions, ask your nurse or doctor.          STOP taking these medications    glucose blood test strip Stopped by: Abbie Sons, MD       TAKE these medications    aspirin EC 81 MG tablet Take by mouth.   atorvastatin 40 MG tablet Commonly known as: LIPITOR TAKE 1 TABLET BY MOUTH ONCE DAILY. DISCONTINUE ATORVASTATIN 20MG .   chlorthalidone 25 MG tablet Commonly known as: HYGROTON Take 1/2 tablet by mouth every morning   glipiZIDE 5 MG 24 hr tablet Commonly known as: GLUCOTROL XL Take 1 tablet (5 mg total) by mouth daily.   lisinopril 10 MG  tablet Commonly known as: ZESTRIL Take 1 tablet (10 mg total) by mouth daily.   metFORMIN 1000 MG tablet Commonly known as: GLUCOPHAGE Take 1 tablet (1,000 mg total) by mouth 2 (two) times daily with a meal.   testosterone cypionate 200 MG/ML injection Commonly known as: DEPOTESTOSTERONE CYPIONATE INJECT 1 ML INTRAMUSCULARLY EVERY 14 DAYS   Victoza 18 MG/3ML Sopn Generic drug: liraglutide 1.2 mg SQ daily What changed: Another medication with the same name was removed. Continue taking this medication, and follow the directions you see here. Changed by: Abbie Sons, MD        Allergies: No Known Allergies  Family History: Family History  Problem Relation Age of Onset   Diabetes Mother    Diabetes Paternal Grandmother    Hypertension Paternal Grandmother    Diabetes Brother    Blindness Brother        due to DM    Social History:  reports that he has never smoked. He has never used smokeless tobacco. No history on file for alcohol use and drug use.   Physical Exam: BP 116/72   Pulse 98   Ht 5\' 10"  (1.778 m)   Wt 249 lb (112.9 kg)   BMI 35.73 kg/m   Constitutional:  Alert and oriented, No acute distress. HEENT: [place] AT, moist mucus membranes.  Trachea  midline, no masses. Cardiovascular: No clubbing, cyanosis, or edema. Respiratory: Normal respiratory effort, no increased work of breathing. GI: Abdomen is soft, nontender, nondistended, no abdominal masses GU: No CVA tenderness Skin: No rashes, bruises or suspicious lesions. Neurologic: Grossly intact, no focal deficits, moving all 4 extremities. Psychiatric: Normal mood and affect.   Assessment & Plan:    1.  Hypogonadism Stable on TRT Labs in 6 months PSA, testosterone, hematocrit Office visit 1 year PSA, testosterone, hematocrit and DRE  2.  Elevated PSA Previous benign biopsy PSA 3.3 Benign DRE Schedule prostate MRI   Riki Altes, MD  [facility] [address],  [address] [place] [address], [place] [address] [phone]

## 2021-08-02 ENCOUNTER — Encounter: Payer: Self-pay | Admitting: Urology

## 2021-08-03 ENCOUNTER — Ambulatory Visit: Payer: Self-pay

## 2021-08-03 ENCOUNTER — Other Ambulatory Visit: Payer: Self-pay

## 2021-08-03 DIAGNOSIS — E291 Testicular hypofunction: Secondary | ICD-10-CM

## 2021-08-03 NOTE — Progress Notes (Signed)
Pt presents today for bi-weekly testosterone injection. CL,RMA 

## 2021-08-06 ENCOUNTER — Other Ambulatory Visit: Payer: Self-pay

## 2021-08-06 ENCOUNTER — Ambulatory Visit (INDEPENDENT_AMBULATORY_CARE_PROVIDER_SITE_OTHER): Payer: 59 | Admitting: Internal Medicine

## 2021-08-06 VITALS — BP 104/78 | HR 93 | Resp 20 | Ht 70.0 in | Wt 242.0 lb

## 2021-08-06 DIAGNOSIS — G4733 Obstructive sleep apnea (adult) (pediatric): Secondary | ICD-10-CM | POA: Diagnosis not present

## 2021-08-06 DIAGNOSIS — Z9989 Dependence on other enabling machines and devices: Secondary | ICD-10-CM | POA: Diagnosis not present

## 2021-08-06 DIAGNOSIS — Z7189 Other specified counseling: Secondary | ICD-10-CM | POA: Insufficient documentation

## 2021-08-06 NOTE — Progress Notes (Signed)
[facility] [place], [place] [address]  Pulmonary Sleep Medicine   Office Visit Note  Patient Name: Ernest [name] DOB: 1956/07/26 MRN [id number]    Chief Complaint: Obstructive Sleep Apnea visit  Brief History:  Ernest [name] is seen today for 30 day follow up and initial consult to establish care.  The patient has a 2.5 year history of sleep apnea with original symptoms including snoring & sleepiness; additional factors include medical history of  type2 diabetes, chronic kidney disease,  hypertension, high cholesterol, gout, . Patient is using PAP nightly @ 4 - 20 cmH2O.  The patient feels well rested & good after sleeping with PAP.  The patient reports benefiting from PAP use. Reported sleepiness is  resolved and the Epworth Sleepiness Score is 6 out of 24. The patient does not take naps. The patient complains of the following: excessive dry mouth.  The compliance download shows  compliance with an average use time of 4:44 hours @ 70%. Patient said he sleeps less now because he rests better and works 2 jobs.  Also, fewer bathroom trips at night.The AHI is .06  The patient does not complain of limb movements disrupting sleep.  ROS  General: (-) fever, (-) chills, (-) night sweat Nose and Sinuses: (-) nasal stuffiness or itchiness, (-) postnasal drip, (-) nosebleeds, (-) sinus trouble. Mouth and Throat: (-) sore throat, (-) hoarseness. Neck: (-) swollen glands, (-) enlarged thyroid, (-) neck pain. Respiratory: - cough, - shortness of breath, - wheezing. Neurologic: - numbness, - tingling. Psychiatric: - anxiety, - depression   Current Medication: Outpatient Encounter Medications as of 08/06/2021  Medication Sig   aspirin EC 81 MG tablet Take by mouth.   atorvastatin (LIPITOR) 40 MG tablet TAKE 1 TABLET BY MOUTH ONCE DAILY. DISCONTINUE ATORVASTATIN 20MG.   chlorthalidone (HYGROTON) 25 MG tablet Take 1/2 tablet by mouth every morning   glipiZIDE (GLUCOTROL XL)  5 MG 24 hr tablet Take 1 tablet (5 mg total) by mouth daily.   liraglutide (VICTOZA) 18 MG/3ML SOPN 1.2 mg SQ daily   lisinopril (ZESTRIL) 10 MG tablet Take 1 tablet (10 mg total) by mouth daily.   metFORMIN (GLUCOPHAGE) 1000 MG tablet Take 1 tablet (1,000 mg total) by mouth 2 (two) times daily with a meal.   testosterone cypionate (DEPOTESTOSTERONE CYPIONATE) 200 MG/ML injection INJECT 1 ML INTRAMUSCULARLY EVERY 14 DAYS   Facility-Administered Encounter Medications as of 08/06/2021  Medication   testosterone cypionate (DEPOTESTOSTERONE CYPIONATE) injection 200 mg    Surgical History: Past Surgical History:  Procedure Laterality Date   COLONOSCOPY     HERNIA REPAIR      Medical History: Past Medical History:  Diagnosis Date   CKD (chronic kidney disease)    Colon polyps    Diabetes mellitus without complication (HCC)    Elevated lipids    Flat feet    Gout    Hyperkalemia    Hypertension    Over weight     Family History: Non contributory to the present illness  Social History: Social History   Socioeconomic History   Marital status: Married    Spouse name: Not on file   Number of children: Not on file   Years of education: Not on file   Highest education level: Not on file  Occupational History   Not on file  Tobacco Use   Smoking status: Never   Smokeless tobacco: Never  Substance and Sexual Activity   Alcohol use: Not on file  Drug use: Not on file   Sexual activity: Not on file  Other Topics Concern   Not on file  Social History Narrative   Not on file   Social Determinants of Health   Financial Resource Strain: Not on file  Food Insecurity: Not on file  Transportation Needs: Not on file  Physical Activity: Not on file  Stress: Not on file  Social Connections: Not on file  Intimate Partner Violence: Not on file    Vital Signs: Blood pressure 104/78, pulse 93, resp. rate 20, height 5' 10" (1.778 m), weight 242 lb (109.8 kg), SpO2 98  %.  Examination: General Appearance: The patient is well-developed, well-nourished, and in no distress. Neck Circumference: 42 cm Skin: Gross inspection of skin unremarkable. Head: normocephalic, no gross deformities. Eyes: no gross deformities noted. ENT: ears appear grossly normal Neurologic: Alert and oriented. No involuntary movements.    EPWORTH SLEEPINESS SCALE:  Scale:  (0)= no chance of dozing; (1)= slight chance of dozing; (2)= moderate chance of dozing; (3)= high chance of dozing  Chance  Situtation    Sitting and reading: 1    Watching TV: 2    Sitting Inactive in public: 0    As a passenger in car: 2      Lying down to rest: 0    Sitting and talking: 0    Sitting quielty after lunch: 1    In a car, stopped in traffic: 0   TOTAL SCORE:   6 out of 24    SLEEP STUDIES:  PSG 04/04/21 - overall AHI  16.1,  REM AHI 35.3,  Min SpO2 85%   CPAP COMPLIANCE DATA:  Date Range: 07/07/21 - 08/05/21  Average Daily Use: 4:44 hours  Median Use: 4:54 hours  Compliance for > 4 Hours: 70% days  AHI: 0.6 respiratory events per hour  Days Used: 29/30  Mask Leak: 13 lpm  95th Percentile Pressure: 12.8cmH2)         LABS: Recent Results (from the past 2160 hour(s))  Hgb A1c w/o eAG     Status: Abnormal   Collection Time: 07/12/21  8:11 AM  Result Value Ref Range   Hgb A1c MFr Bld 6.3 (H) 4.8 - 5.6 %    Comment:          Prediabetes: 5.7 - 6.4          Diabetes: >6.4          Glycemic control for adults with diabetes: <7.0   BUN+Creat     Status: Abnormal   Collection Time: 07/12/21  8:11 AM  Result Value Ref Range   BUN 19 8 - 27 mg/dL   Creatinine, Ser 1.71 (H) 0.76 - 1.27 mg/dL   eGFR 44 (L) >59 mL/min/1.73   BUN/Creatinine Ratio 11 10 - 24  Hematocrit     Status: None   Collection Time: 07/27/21  8:33 AM  Result Value Ref Range   Hematocrit 46.8 37.5 - 51.0 %  PSA     Status: Abnormal   Collection Time: 07/27/21  8:33 AM  Result Value  Ref Range   Prostate Specific Ag, Serum 5.4 (H) 0.0 - 4.0 ng/mL    Comment: Roche ECLIA methodology. According to the American Urological Association, Serum PSA should decrease and remain at undetectable levels after radical prostatectomy. The AUA defines biochemical recurrence as an initial PSA value 0.2 ng/mL or greater followed by a subsequent confirmatory PSA value 0.2 ng/mL or greater. Values obtained with different  assay methods or kits cannot be used interchangeably. Results cannot be interpreted as absolute evidence of the presence or absence of malignant disease.   Testosterone     Status: Abnormal   Collection Time: 07/27/21  8:33 AM  Result Value Ref Range   Testosterone 948 (H) 264 - 916 ng/dL    Comment: Adult male reference interval is based on a population of healthy nonobese males (BMI <30) between 54 and 81 years old. [place], [place] [phone]. PMID: [id number].     Radiology: MR PROSTATE W WO CONTRAST  Result Date: 08/29/2020 CLINICAL DATA:  Abnormal PSA, elevated. EXAM: MR PROSTATE WITHOUT AND WITH CONTRAST TECHNIQUE: Multiplanar multisequence MRI images were obtained of the pelvis centered about the prostate. Pre and post contrast images were obtained. CONTRAST:  47m GADAVIST GADOBUTROL 1 MMOL/ML IV SOLN COMPARISON:  None. FINDINGS: Prostate: Diffuse reduced T2 signal throughout the peripheral zone with some minimal sparing in the left basilar anterior peripheral zone, with diffuse accentuated enhancement. Given the lack of focal lesion this is considered PI-RADS category 2, and more likely to be chronic postinflammatory signal. Encapsulated nodularity in the central gland compatible with benign prostatic hypertrophy. After careful multiparametric scrutiny, I do not identify a focal lesion of intermediate or higher suspicion for prostate cancer. Volume: 3D volumetric analysis: Prostate volume 59.00 cubic cm (5.3 by 4.2 by 5.3 cm). Transcapsular spread:   Absent Seminal vesicle involvement: Absent Neurovascular bundle involvement: Absent Pelvic adenopathy: Absent Bone metastasis: Absent Other findings: No supplemental non-categorized findings. IMPRESSION: 1. No focal lesion of intermediate or higher suspicion for prostate cancer. 2. Diminished T2 signal throughout the peripheral zone with associated diffuse enhancement. Given the lack of focal lesion this is considered PI-RADS category 2, and more likely to be chronic postinflammatory signal. 3. Encapsulated nodularity in the central gland compatible with benign prostatic hypertrophy. Prostate volume 59.00 cubic cm. Electronically Signed   By: WVan ClinesM.D.   On: 08/29/2020 15:43    No results found.  No results found.    Assessment and Plan: Patient Active Problem List   Diagnosis Date Noted   OSA on CPAP 08/06/2021   CPAP use counseling 08/06/2021   OSA (obstructive sleep apnea) 04/09/2021   Other hypersomnia 04/09/2021   Essential hypertension 06/08/2019   Type 2 diabetes mellitus with stage 3 chronic kidney disease, without long-term current use of insulin ([place] 06/08/2019   Chronic kidney disease (CKD) stage G3a/A2, moderately decreased glomerular filtration rate (GFR) between 45-59 mL/min/1.73 square meter and albuminuria creatinine ratio between 30-299 mg/g 06/08/2019   Class 2 severe obesity due to excess calories with serious comorbidity and body mass index (BMI) of 38.0 to 38.9 in adult ([facility] 06/08/2019   Hypogonadism in male 06/08/2019   At risk for obstructive sleep apnea 06/08/2019   Other hyperlipidemia 06/08/2019   Persistent proteinuria 03/24/2018   1. OSA on CPAP The patient does tolerate PAP and reports  benefit from PAP use. The patient was reminded how to clean equipment and advised to replace supplies routinely. The patient was also counselled on weight loss. The compliance is good. The AHI is 0.6.   OSA- increase compliance with pap. F/u 46m 2. CPAP use  counseling CPAP Counseling: had a lengthy discussion with the patient regarding the importance of PAP therapy in management of the sleep apnea. Patient appears to understand the risk factor reduction and also understands the risks associated with untreated sleep apnea. Patient will try to make a good faith effort  to remain compliant with therapy. Also instructed the patient on proper cleaning of the device including the water must be changed daily if possible and use of distilled water is preferred. Patient understands that the machine should be regularly cleaned with appropriate recommended cleaning solutions that do not damage the PAP machine for example given white vinegar and water rinses. Other methods such as ozone treatment may not be as good as these simple methods to achieve cleaning.      General Counseling: I have discussed the findings of the evaluation and examination with Ernest [name].  I have also discussed any further diagnostic evaluation thatmay be needed or ordered today. Ernest [name] verbalizes understanding of the findings of todays visit. We also reviewed his medications today and discussed drug interactions and side effects including but not limited excessive drowsiness and altered mental states. We also discussed that there is always a risk not just to him but also people around him. he has been encouraged to call the office with any questions or concerns that should arise related to todays visit.  No orders of the defined types were placed in this encounter.       I have personally obtained a history, examined the patient, evaluated laboratory and imaging results, formulated the assessment and plan and placed orders. This patient was seen today by Tressie Ellis, PA-C in collaboration with Dr. Devona [name].   Allyne Gee, MD [facility] Diplomate ABMS Pulmonary Critical Care Medicine and Sleep Medicine

## 2021-08-06 NOTE — Patient Instructions (Signed)

## 2021-08-17 ENCOUNTER — Other Ambulatory Visit: Payer: Self-pay

## 2021-08-17 ENCOUNTER — Ambulatory Visit: Payer: Self-pay

## 2021-08-17 DIAGNOSIS — E291 Testicular hypofunction: Secondary | ICD-10-CM

## 2021-08-17 NOTE — Progress Notes (Signed)
72ml (200mg ) of Testosterone Cypionate injected into Right Deltoid without complications today 08/17/21. Pt declined to have BP assessment completed. He denies symptoms of illness or changes in his medical condition. AL RN

## 2021-08-31 ENCOUNTER — Ambulatory Visit: Payer: Self-pay

## 2021-08-31 DIAGNOSIS — G4733 Obstructive sleep apnea (adult) (pediatric): Secondary | ICD-10-CM | POA: Diagnosis not present

## 2021-08-31 DIAGNOSIS — E291 Testicular hypofunction: Secondary | ICD-10-CM

## 2021-08-31 MED ORDER — TESTOSTERONE CYPIONATE 200 MG/ML IM SOLN
200.0000 mg | INTRAMUSCULAR | Status: AC
  Administered 2021-08-31 – 2022-01-18 (×9): 200 mg via INTRAMUSCULAR

## 2021-08-31 NOTE — Progress Notes (Signed)
Pt received bi-weekly injection of testosterone.  Left Deltoid.

## 2021-09-05 ENCOUNTER — Other Ambulatory Visit: Payer: Self-pay

## 2021-09-05 ENCOUNTER — Encounter: Payer: Self-pay | Admitting: Physician Assistant

## 2021-09-05 ENCOUNTER — Ambulatory Visit: Payer: Self-pay | Admitting: Physician Assistant

## 2021-09-05 VITALS — BP 100/60 | HR 104 | Temp 97.1°F | Resp 16 | Ht 70.0 in | Wt 238.0 lb

## 2021-09-05 DIAGNOSIS — H04123 Dry eye syndrome of bilateral lacrimal glands: Secondary | ICD-10-CM

## 2021-09-05 DIAGNOSIS — R682 Dry mouth, unspecified: Secondary | ICD-10-CM

## 2021-09-05 NOTE — Progress Notes (Signed)
   Subjective: Dry mouth and eyes.    Patient ID: Ernest [name], male    DOB: [birth year], 65 y.o.   MRN: [id number]  HPI Patient was to discuss increasing dry mouth and eyes in the past month.  Patient states that the dry mouth was secondary to using CPAP after being diagnosed with sleep apnea.  Patient states mild improvement after adjusting his humidifier on the machine.  Patient also using lozenges for dry mouth.  Patient states dry eyes which worsens towards the afternoon.  Patient relates to a 50 pound weight loss since January 2022 secondary to diet exercise and using Victoza.  It was also noted blood pressure was 100/60.  Patient state secondary to his chronic kidney disease he takes lisinopril 10 mg and hygroton 25 mg.  Patient said a few months ago his kidney doctor told him to decrease to 25 mg of hygroton to 12.5 mg.  Review of Systems Chronic kidney disease, diabetes, hyperlipidemia, hypertension, hypogonadism, and sleep apnea.    Objective:   Physical Exam No acute distress.  Temperature 97.1, pulse 104, respiration 16, BP is 100/60, and patient is 98% O2 sat on room air.  Patient weighs 238 pounds.  BMI is 34.15. HEENT is unremarkable.  Neck is supple full lymphadenopathy or bruits.  Lungs are clear to auscultation.  Heart regular rate and rhythm.       Assessment & Plan: Dry and mouth.    I voce concern for Sjogren Syndrome. Advised patient that we will do a 1 week trial of discontinuing Hygroton.  Patient will record blood pressure at home and return back to clinic if he has rebound elevated blood pressures.  Lab lab tests consisting of BMP, CBC, ESR, UA,and  ANA.

## 2021-09-05 NOTE — Progress Notes (Signed)
Has lost 50 since January 2022 Started injections 2-3 months ago - Ventoza  Dry mouth since started using CPAP Recently eyes started getting dry.  AMD

## 2021-09-07 ENCOUNTER — Other Ambulatory Visit: Payer: Self-pay

## 2021-09-07 DIAGNOSIS — H04123 Dry eye syndrome of bilateral lacrimal glands: Secondary | ICD-10-CM

## 2021-09-07 LAB — POCT URINALYSIS DIPSTICK
Appearance: NORMAL
Bilirubin, UA: NEGATIVE
Blood, UA: NEGATIVE
Glucose, UA: NEGATIVE
Ketones, UA: NEGATIVE
Leukocytes, UA: NEGATIVE
Nitrite, UA: NEGATIVE
Protein, UA: NEGATIVE
Spec Grav, UA: 1.02 (ref 1.010–1.025)
Urobilinogen, UA: 0.2 E.U./dL
pH, UA: 5 (ref 5.0–8.0)

## 2021-09-07 NOTE — Progress Notes (Signed)
Pt presents today for labs per Ron smith./CL,RMA

## 2021-09-08 LAB — BASIC METABOLIC PANEL
BUN/Creatinine Ratio: 11 (ref 10–24)
BUN: 21 mg/dL (ref 8–27)
CO2: 24 mmol/L (ref 20–29)
Calcium: 9.6 mg/dL (ref 8.6–10.2)
Chloride: 96 mmol/L (ref 96–106)
Creatinine, Ser: 1.83 mg/dL — ABNORMAL HIGH (ref 0.76–1.27)
Glucose: 96 mg/dL (ref 70–99)
Potassium: 5 mmol/L (ref 3.5–5.2)
Sodium: 133 mmol/L — ABNORMAL LOW (ref 134–144)
eGFR: 41 mL/min/{1.73_m2} — ABNORMAL LOW (ref >59)

## 2021-09-08 LAB — CBC WITH DIFFERENTIAL/PLATELET
Basophils Absolute: 0 10*3/uL (ref 0.0–0.2)
Basos: 0 %
EOS (ABSOLUTE): 0.2 10*3/uL (ref 0.0–0.4)
Eos: 3 %
Hematocrit: 47.3 % (ref 37.5–51.0)
Hemoglobin: 15.2 g/dL (ref 13.0–17.7)
Immature Grans (Abs): 0 10*3/uL (ref 0.0–0.1)
Immature Granulocytes: 1 %
Lymphocytes Absolute: 0.6 10*3/uL — ABNORMAL LOW (ref 0.7–3.1)
Lymphs: 8 %
MCH: 25.7 pg — ABNORMAL LOW (ref 26.6–33.0)
MCHC: 32.1 g/dL (ref 31.5–35.7)
MCV: 80 fL (ref 79–97)
Monocytes Absolute: 1.2 10*3/uL — ABNORMAL HIGH (ref 0.1–0.9)
Monocytes: 17 %
Neutrophils Absolute: 5 10*3/uL (ref 1.4–7.0)
Neutrophils: 71 %
Platelets: 279 10*3/uL (ref 150–450)
RBC: 5.91 x10E6/uL — ABNORMAL HIGH (ref 4.14–5.80)
RDW: 14.7 % (ref 11.6–15.4)
WBC: 7 10*3/uL (ref 3.4–10.8)

## 2021-09-08 LAB — ANA W/REFLEX IF POSITIVE: Anti Nuclear Antibody (ANA): NEGATIVE

## 2021-09-08 LAB — SEDIMENTATION RATE: Sed Rate: 57 mm/hr — ABNORMAL HIGH (ref 0–30)

## 2021-09-13 ENCOUNTER — Ambulatory Visit: Payer: Self-pay | Admitting: Physician Assistant

## 2021-09-13 ENCOUNTER — Encounter: Payer: Self-pay | Admitting: Physician Assistant

## 2021-09-13 DIAGNOSIS — K117 Disturbances of salivary secretion: Secondary | ICD-10-CM

## 2021-09-13 NOTE — Progress Notes (Signed)
° °  Subjective: Dry eyes and mouth.    Patient ID: Ernest [name], male    DOB: [birth year], 65 y.o.   MRN: [id number]  HPI Patient follow-up 1 week for reevaluation of dry mouth and eyes.  Patient has stopped his diuretic as directed and noticed no improvement.  Patient is a complaint increases over the past 3 to 5 days.  States requires multiple sips of water especially to eat dry foods.  Wish to discuss lab results that were taken last week after voiced concern for Sjogren Syndrome.    Review of Systems Diabetes, hyperlipidemia, hypertension.    Objective:   Physical Exam  This is a virtual visit.      Assessment & Plan:Xerostomia   Discussed lab results consisting of ANA, BMP, CBC, sed rate.  Sympt-X rate was slightly elevated.  Rest of labs were negative or unremarkable. Advised patient to restart his diuretic and a consult to ENT will be generated for definitive evaluation and treatment.

## 2021-09-13 NOTE — Progress Notes (Signed)
Pt following up on dry mouth. Stopping certain medications have not helped. Pt stainting he believes its his sinus's due to when his nose is stuffy it seems to get worse due to having to breathe through mouth.

## 2021-09-14 ENCOUNTER — Other Ambulatory Visit: Payer: Self-pay

## 2021-09-14 ENCOUNTER — Ambulatory Visit: Payer: Self-pay

## 2021-09-14 DIAGNOSIS — E291 Testicular hypofunction: Secondary | ICD-10-CM

## 2021-09-14 NOTE — Progress Notes (Signed)
Pt presented today for bi-weekly testosterone injection. L Deltoid.

## 2021-09-18 NOTE — Addendum Note (Signed)
Addended by: Gardner Candle on: 09/18/2021 02:07 PM   Modules accepted: Orders

## 2021-09-28 ENCOUNTER — Ambulatory Visit: Payer: 59

## 2021-09-28 ENCOUNTER — Other Ambulatory Visit: Payer: Self-pay

## 2021-09-28 ENCOUNTER — Ambulatory Visit
Admission: RE | Admit: 2021-09-28 | Discharge: 2021-09-28 | Disposition: A | Discharge status: Home / Self Care | Payer: 59 | Source: Ambulatory Visit | Attending: Urology | Admitting: Urology

## 2021-09-28 DIAGNOSIS — R972 Elevated prostate specific antigen [PSA]: Secondary | ICD-10-CM | POA: Diagnosis not present

## 2021-09-28 DIAGNOSIS — E291 Testicular hypofunction: Secondary | ICD-10-CM

## 2021-09-28 DIAGNOSIS — R590 Localized enlarged lymph nodes: Secondary | ICD-10-CM | POA: Diagnosis not present

## 2021-09-28 IMAGING — MR MR PROSTATE WO/W CM
56 series · 56 of 56 positions shown · IV contrast (gadavist)
Comparison: Comparison is made with examination from [DATE].

CLINICAL DATA: Rising PSA with history of benign prostate biopsy in
the past.

EXAM:
MR PROSTATE WITHOUT AND WITH CONTRAST
TECHNIQUE: Multiplanar multisequence MRI images were obtained of the pelvis
centered about the prostate. Pre and post contrast images were
obtained.
CONTRAST:  10mL GADAVIST GADOBUTROL 1 MMOL/ML IV SOLN

[Series 2: ax in&out whole · axial · 6.0mm · 0.74mm/px · 1 of 35 slices shown (1 of 2)]
[im 1/35]
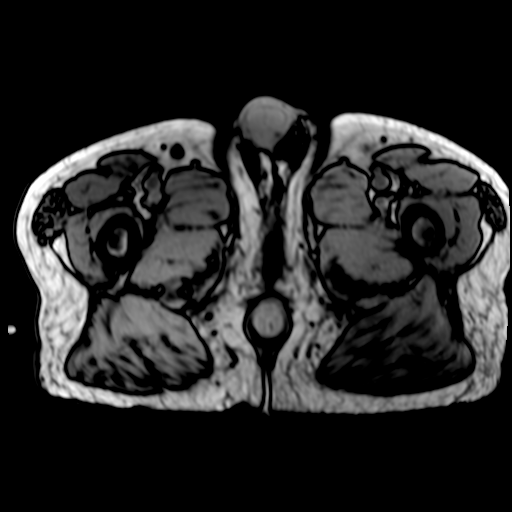

[Series 2: ax in&out whole · axial · 6.0mm · 0.74mm/px · 1 of 35 slices shown (2 of 2)]
[im 1/35]
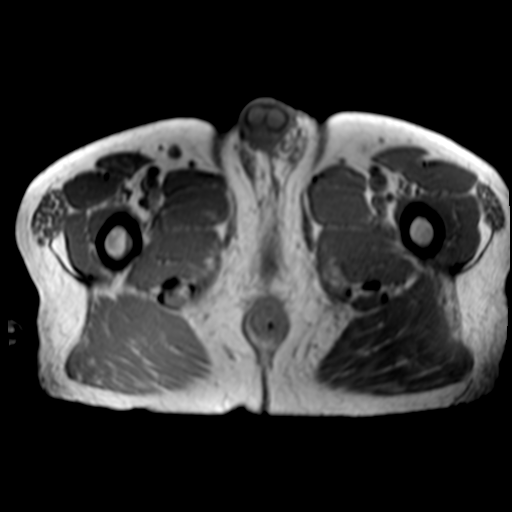

[Series 4: T2 · coronal · 3.0mm · 0.70mm/px · 1 of 35 slices shown (1 of 3)]
[im 1/35]
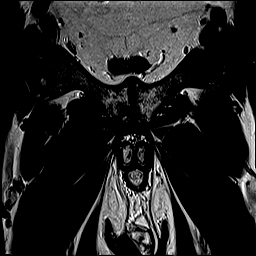

[Series 5: T2 · axial · 3.0mm · 0.56mm/px · 1 of 25 slices shown (2 of 3)]
[im 1/25]
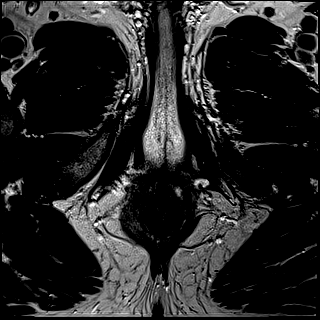

[Series 6: DWI · axial · 3.0mm · 0.86mm/px · 1 of 75 slices shown (1 of 3)]
[im 1/75]
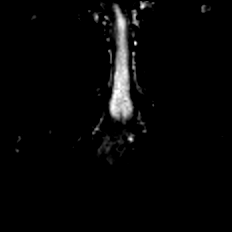

[Series 7: DWI · axial · 3.0mm · 0.86mm/px · 1 of 25 slices shown (2 of 3)]
[im 1/25]
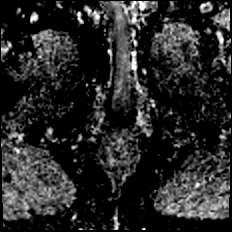

[Series 8: DWI · axial · 3.0mm · 0.86mm/px · 1 of 25 slices shown (3 of 3)]
[im 1/25]
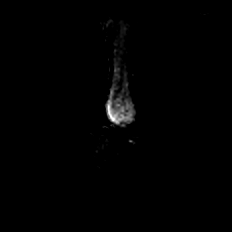

[Series 9: T2 · axial · 1.0mm · 1.04mm/px · 1 of 80 slices shown (3 of 3)]
[im 1/80]
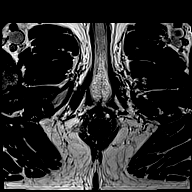

[Series 10: T1 · axial · 3.0mm · 1.15mm/px · 1 of 28 slices shown (1 of 48)]
[im 1/28]
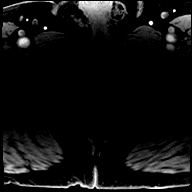

[Series 11: T1 · axial · 3.0mm · 1.15mm/px · 1 of 28 slices shown (2 of 48)]
[im 1/28]
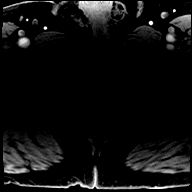

[Series 12: T1 · axial · 3.0mm · 1.15mm/px · 1 of 28 slices shown (3 of 48)]
[im 1/28]
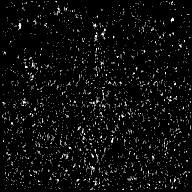

[Series 13: T1 · axial · 3.0mm · 1.15mm/px · 1 of 28 slices shown (4 of 48)]
[im 1/28]
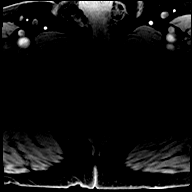

[Series 14: T1 · axial · 3.0mm · 1.15mm/px · 1 of 23 slices shown (5 of 48)]
[im 1/23]
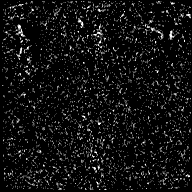

[Series 15: T1 · axial · 3.0mm · 1.15mm/px · 1 of 28 slices shown (6 of 48)]
[im 1/28]
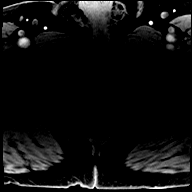

[Series 16: T1 · axial · 3.0mm · 1.15mm/px · 1 of 28 slices shown (7 of 48)]
[im 1/28]
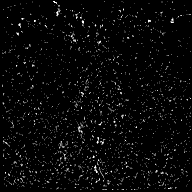

[Series 17: T1 · axial · 3.0mm · 1.15mm/px · 1 of 28 slices shown (8 of 48)]
[im 1/28]
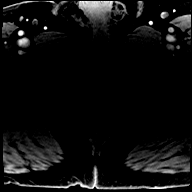

[Series 18: T1 · axial · 3.0mm · 1.15mm/px · 1 of 28 slices shown (9 of 48)]
[im 1/28]
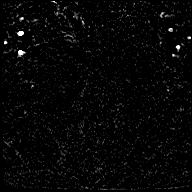

[Series 19: T1 · axial · 3.0mm · 1.15mm/px · 1 of 28 slices shown (10 of 48)]
[im 1/28]
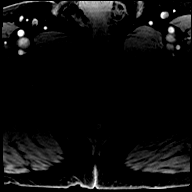

[Series 20: T1 · axial · 3.0mm · 1.15mm/px · 1 of 28 slices shown (11 of 48)]
[im 1/28]
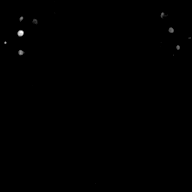

[Series 21: T1 · axial · 3.0mm · 1.15mm/px · 1 of 28 slices shown (12 of 48)]
[im 1/28]
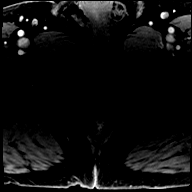

[Series 22: T1 · axial · 3.0mm · 1.15mm/px · 1 of 28 slices shown (13 of 48)]
[im 1/28]
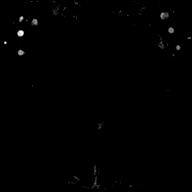

[Series 23: T1 · axial · 3.0mm · 1.15mm/px · 1 of 28 slices shown (14 of 48)]
[im 1/28]
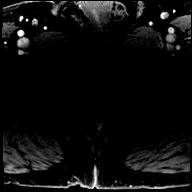

[Series 24: T1 · axial · 3.0mm · 1.15mm/px · 1 of 28 slices shown (15 of 48)]
[im 1/28]
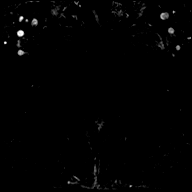

[Series 25: T1 · axial · 3.0mm · 1.15mm/px · 1 of 28 slices shown (16 of 48)]
[im 1/28]
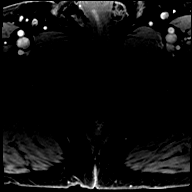

[Series 26: T1 · axial · 3.0mm · 1.15mm/px · 1 of 28 slices shown (17 of 48)]
[im 1/28]
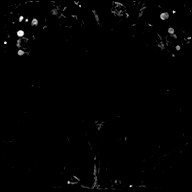

[Series 27: T1 · axial · 3.0mm · 1.15mm/px · 1 of 28 slices shown (18 of 48)]
[im 1/28]
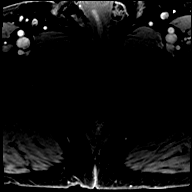

[Series 28: T1 · axial · 3.0mm · 1.15mm/px · 1 of 28 slices shown (19 of 48)]
[im 1/28]
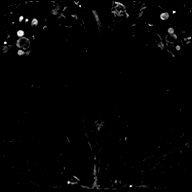

[Series 29: T1 · axial · 3.0mm · 1.15mm/px · 1 of 28 slices shown (20 of 48)]
[im 1/28]
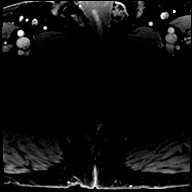

[Series 30: T1 · axial · 3.0mm · 1.15mm/px · 1 of 28 slices shown (21 of 48)]
[im 1/28]
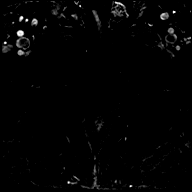

[Series 31: T1 · axial · 3.0mm · 1.15mm/px · 1 of 28 slices shown (22 of 48)]
[im 1/28]
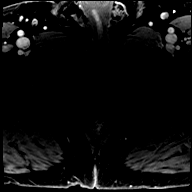

[Series 32: T1 · axial · 3.0mm · 1.15mm/px · 1 of 28 slices shown (23 of 48)]
[im 1/28]
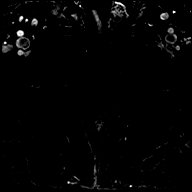

[Series 33: T1 · axial · 3.0mm · 1.15mm/px · 1 of 28 slices shown (24 of 48)]
[im 1/28]
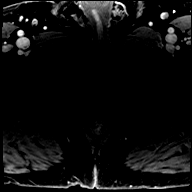

[Series 34: T1 · axial · 3.0mm · 1.15mm/px · 1 of 28 slices shown (25 of 48)]
[im 1/28]
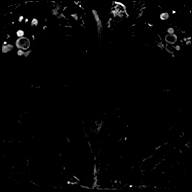

[Series 35: T1 · axial · 3.0mm · 1.15mm/px · 1 of 28 slices shown (26 of 48)]
[im 1/28]
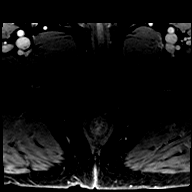

[Series 36: T1 · axial · 3.0mm · 1.15mm/px · 1 of 28 slices shown (27 of 48)]
[im 1/28]
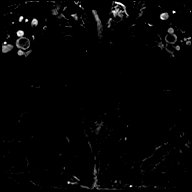

[Series 37: T1 · axial · 3.0mm · 1.15mm/px · 1 of 28 slices shown (28 of 48)]
[im 1/28]
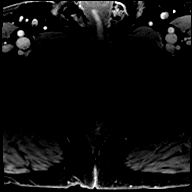

[Series 38: T1 · axial · 3.0mm · 1.15mm/px · 1 of 28 slices shown (29 of 48)]
[im 1/28]
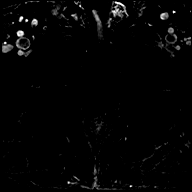

[Series 39: T1 · axial · 3.0mm · 1.15mm/px · 1 of 28 slices shown (30 of 48)]
[im 1/28]
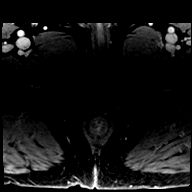

[Series 40: T1 · axial · 3.0mm · 1.15mm/px · 1 of 28 slices shown (31 of 48)]
[im 1/28]
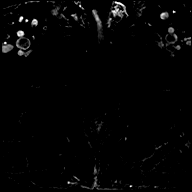

[Series 41: T1 · axial · 3.0mm · 1.15mm/px · 1 of 28 slices shown (32 of 48)]
[im 1/28]
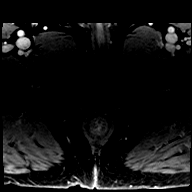

[Series 42: T1 · axial · 3.0mm · 1.15mm/px · 1 of 28 slices shown (33 of 48)]
[im 1/28]
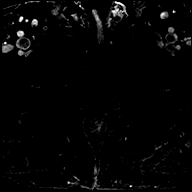

[Series 43: T1 · axial · 3.0mm · 1.15mm/px · 1 of 28 slices shown (34 of 48)]
[im 1/28]
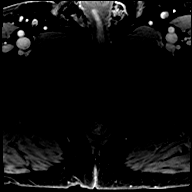

[Series 44: T1 · axial · 3.0mm · 1.15mm/px · 1 of 28 slices shown (35 of 48)]
[im 1/28]
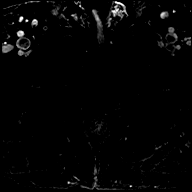

[Series 45: T1 · axial · 3.0mm · 1.15mm/px · 1 of 28 slices shown (36 of 48)]
[im 1/28]
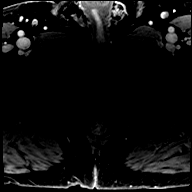

[Series 46: T1 · axial · 3.0mm · 1.15mm/px · 1 of 28 slices shown (37 of 48)]
[im 1/28]
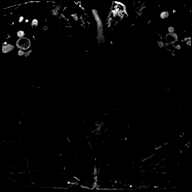

[Series 47: T1 · axial · 3.0mm · 1.15mm/px · 1 of 28 slices shown (38 of 48)]
[im 1/28]
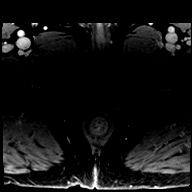

[Series 48: T1 · axial · 3.0mm · 1.15mm/px · 1 of 28 slices shown (39 of 48)]
[im 1/28]
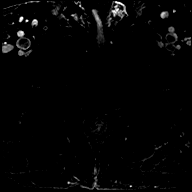

[Series 49: T1 · axial · 3.0mm · 1.15mm/px · 1 of 28 slices shown (40 of 48)]
[im 1/28]
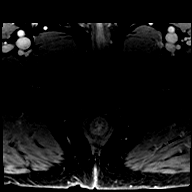

[Series 50: T1 · axial · 3.0mm · 1.15mm/px · 1 of 28 slices shown (41 of 48)]
[im 1/28]
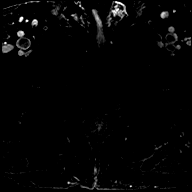

[Series 51: T1 · axial · 3.0mm · 1.15mm/px · 1 of 28 slices shown (42 of 48)]
[im 1/28]
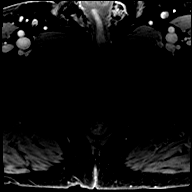

[Series 52: T1 · axial · 3.0mm · 1.15mm/px · 1 of 28 slices shown (43 of 48)]
[im 1/28]
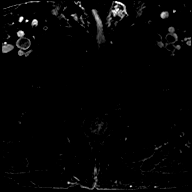

[Series 53: T1 · axial · 3.0mm · 1.15mm/px · 1 of 28 slices shown (44 of 48)]
[im 1/28]
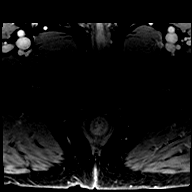

[Series 54: T1 · axial · 3.0mm · 1.15mm/px · 1 of 28 slices shown (45 of 48)]
[im 1/28]
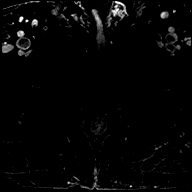

[Series 55: T1 · axial · 3.0mm · 1.15mm/px · 1 of 28 slices shown (46 of 48)]
[im 1/28]
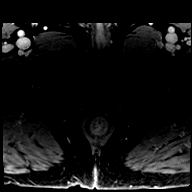

[Series 56: T1 · axial · 3.0mm · 1.15mm/px · 1 of 28 slices shown (47 of 48)]
[im 1/28]
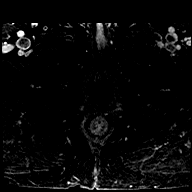

[Series 57: T1 · axial · 3.0mm · 1.15mm/px · 1 of 28 slices shown (48 of 48)]
[im 1/28]
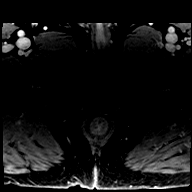

[56 of 56 positions shown; findings below may reference images not displayed]

FINDINGS: Prostate:

Peripheral zone: Diffuse low signal throughout the posterior
prostate and corresponding dark appearance on ADC throughout the
entire posterior peripheral zone. No focal area of restricted
diffusion. The appearance is quite similar to prior imaging.

Transitional zone: No signs of high-risk lesion in the transitional
zone. Changes of BPH.

Volume: 5.3 x 4.0 x 4.6 (volume = 51 cc) cm

Transcapsular spread:  Absent

Seminal vesicle involvement: Absent

Neurovascular bundle involvement: Absent

Pelvic adenopathy: Absent

Bone metastasis: Absent

Other findings: None
IMPRESSION: Diffuse low signal throughout the posterior prostate and
corresponding dark appearance on ADC throughout the entire posterior
peripheral zone. The appearance is quite similar to prior imaging,
more likely related to prostatitis. No evidence of high-risk lesion.
Overall assessment PIRADS category 2. Occasionally diffuse cancer
can present with this appearance though in light of prior imaging
and recent biopsy results this is felt to be less likely.

## 2021-09-28 MED ORDER — GADOBUTROL 1 MMOL/ML IV SOLN
10.0000 mL | Freq: Once | INTRAVENOUS | Status: AC | PRN
  Administered 2021-09-28: 16:00:00 10 mL via INTRAVENOUS

## 2021-09-28 NOTE — Progress Notes (Signed)
Pt received bi-weekly testosterone injection. R deltoid.

## 2021-10-01 DIAGNOSIS — G4733 Obstructive sleep apnea (adult) (pediatric): Secondary | ICD-10-CM | POA: Diagnosis not present

## 2021-10-04 ENCOUNTER — Telehealth: Payer: Self-pay | Admitting: *Deleted

## 2021-10-04 NOTE — Telephone Encounter (Signed)
-----   Message from Abbie Sons, MD sent at 10/03/2021  7:41 AM EST ----- Prostate MRI shows no abnormalities that are suspicious for high-grade prostate cancer.  Options would be to repeat a standard prostate biopsy or repeat the PSA in approximately 1 month.

## 2021-10-09 ENCOUNTER — Encounter: Payer: Self-pay | Admitting: *Deleted

## 2021-10-19 ENCOUNTER — Other Ambulatory Visit: Payer: Self-pay

## 2021-10-19 ENCOUNTER — Ambulatory Visit: Payer: Self-pay

## 2021-10-19 DIAGNOSIS — E291 Testicular hypofunction: Secondary | ICD-10-CM

## 2021-10-19 NOTE — Progress Notes (Signed)
Pt presents today for bi-weekly injection.  

## 2021-10-25 DIAGNOSIS — R682 Dry mouth, unspecified: Secondary | ICD-10-CM | POA: Diagnosis not present

## 2021-10-25 DIAGNOSIS — J340 Abscess, furuncle and carbuncle of nose: Secondary | ICD-10-CM | POA: Diagnosis not present

## 2021-10-30 ENCOUNTER — Other Ambulatory Visit: Payer: Self-pay

## 2021-10-30 DIAGNOSIS — R682 Dry mouth, unspecified: Secondary | ICD-10-CM

## 2021-10-30 NOTE — Progress Notes (Signed)
Pt presents today for lab order for ENT. Dr. Rowe Clack

## 2021-10-31 LAB — SJOGREN'S SYNDROME ANTIBODS(SSA + SSB)
ENA SSA (RO) Ab: 0.3 AI (ref 0.0–0.9)
ENA SSB (LA) Ab: <0.2 AI (ref 0.0–0.9)

## 2021-11-01 DIAGNOSIS — G4733 Obstructive sleep apnea (adult) (pediatric): Secondary | ICD-10-CM | POA: Diagnosis not present

## 2021-11-02 ENCOUNTER — Ambulatory Visit: Payer: Self-pay

## 2021-11-02 ENCOUNTER — Other Ambulatory Visit: Payer: Self-pay

## 2021-11-02 DIAGNOSIS — E291 Testicular hypofunction: Secondary | ICD-10-CM

## 2021-11-02 NOTE — Progress Notes (Signed)
T-INJECTION GIVEN BY ANN DOBBINS,RN  LEFT DELTOID

## 2021-11-07 ENCOUNTER — Other Ambulatory Visit: Payer: Self-pay

## 2021-11-07 DIAGNOSIS — R972 Elevated prostate specific antigen [PSA]: Secondary | ICD-10-CM

## 2021-11-13 ENCOUNTER — Other Ambulatory Visit: Payer: Self-pay

## 2021-11-13 ENCOUNTER — Other Ambulatory Visit: Payer: 59

## 2021-11-13 DIAGNOSIS — R972 Elevated prostate specific antigen [PSA]: Secondary | ICD-10-CM

## 2021-11-14 LAB — PSA: Prostate Specific Ag, Serum: 4.2 ng/mL — ABNORMAL HIGH (ref 0.0–4.0)

## 2021-11-15 ENCOUNTER — Encounter: Payer: Self-pay | Admitting: *Deleted

## 2021-11-19 ENCOUNTER — Other Ambulatory Visit: Payer: Self-pay | Admitting: *Deleted

## 2021-11-19 MED ORDER — TESTOSTERONE CYPIONATE 200 MG/ML IM SOLN: INTRAMUSCULAR | 0 refills | Status: DC

## 2021-11-23 ENCOUNTER — Ambulatory Visit: Payer: Self-pay

## 2021-11-23 ENCOUNTER — Other Ambulatory Visit: Payer: Self-pay

## 2021-11-23 DIAGNOSIS — E291 Testicular hypofunction: Secondary | ICD-10-CM

## 2021-11-23 NOTE — Progress Notes (Unsigned)
Bi weekly injection, testosterone.

## 2021-11-29 DIAGNOSIS — G4733 Obstructive sleep apnea (adult) (pediatric): Secondary | ICD-10-CM | POA: Diagnosis not present

## 2021-12-03 ENCOUNTER — Ambulatory Visit (INDEPENDENT_AMBULATORY_CARE_PROVIDER_SITE_OTHER): Payer: 59 | Admitting: Internal Medicine

## 2021-12-03 VITALS — BP 124/70 | HR 106 | Resp 16 | Ht 70.0 in | Wt 224.0 lb

## 2021-12-03 DIAGNOSIS — Z9989 Dependence on other enabling machines and devices: Secondary | ICD-10-CM

## 2021-12-03 DIAGNOSIS — Z7189 Other specified counseling: Secondary | ICD-10-CM | POA: Diagnosis not present

## 2021-12-03 DIAGNOSIS — G4733 Obstructive sleep apnea (adult) (pediatric): Secondary | ICD-10-CM | POA: Diagnosis not present

## 2021-12-03 DIAGNOSIS — I10 Essential (primary) hypertension: Secondary | ICD-10-CM

## 2021-12-03 NOTE — Patient Instructions (Signed)

## 2021-12-03 NOTE — Progress Notes (Signed)
[facility] [place], [place] [address]  Pulmonary Sleep Medicine   Office Visit Note  Patient Name: Ernest [name] DOB: 23-Feb-1956 MRN [id number]    Chief Complaint: Obstructive Sleep Apnea visit  Brief History:  Ernest [name] is seen today for follow up appointment.   The patient has a 2.5 year history of sleep apnea. Patient is mostly using PAP nightly.  The patient feels good after sleeping with PAP.  The patient reports benefiting from PAP use. Reported sleepiness is  resolved and the Epworth Sleepiness Score is 5 out of 24. The patient does not take naps. The patient complains of the following: needs supplies  and Dec 22 - Jan 23 patient said he had problems waking with sweats, choking & excessive drymouth through out the day.  Patient said he had testing done, but nothing was found and those symptoms don't exist at this time.  The compliance download shows  compliance with an average use time of 4:09 hours@  62%. The AHI is 0.5  The patient does not complain of limb movements disrupting sleep.  ROS  General: (-) fever, (-) chills, (-) night sweat Nose and Sinuses: (-) nasal stuffiness or itchiness, (-) postnasal drip, (-) nosebleeds, (-) sinus trouble. Mouth and Throat: (-) sore throat, (-) hoarseness. Neck: (-) swollen glands, (-) enlarged thyroid, (-) neck pain. Respiratory: - cough, - shortness of breath, - wheezing. Neurologic: - numbness, - tingling. Psychiatric: - anxiety, - depression   Current Medication: Outpatient Encounter Medications as of 12/03/2021  Medication Sig   aspirin EC 81 MG tablet Take by mouth.   atorvastatin (LIPITOR) 40 MG tablet TAKE 1 TABLET BY MOUTH ONCE DAILY. DISCONTINUE ATORVASTATIN 20MG.   chlorthalidone (HYGROTON) 25 MG tablet Take 1/2 tablet by mouth every morning   glipiZIDE (GLUCOTROL XL) 5 MG 24 hr tablet Take 1 tablet (5 mg total) by mouth daily.   liraglutide (VICTOZA) 18 MG/3ML SOPN 1.2 mg SQ daily   lisinopril  (ZESTRIL) 10 MG tablet Take 1 tablet (10 mg total) by mouth daily.   metFORMIN (GLUCOPHAGE) 1000 MG tablet Take 1 tablet (1,000 mg total) by mouth 2 (two) times daily with a meal.   testosterone cypionate (DEPOTESTOSTERONE CYPIONATE) 200 MG/ML injection INJECT 1 ML INTRAMUSCULARLY EVERY 14 DAYS   Facility-Administered Encounter Medications as of 12/03/2021  Medication   testosterone cypionate (DEPOTESTOSTERONE CYPIONATE) injection 200 mg    Surgical History: Past Surgical History:  Procedure Laterality Date   COLONOSCOPY     HERNIA REPAIR      Medical History: Past Medical History:  Diagnosis Date   CKD (chronic kidney disease)    Colon polyps    Diabetes mellitus without complication (HCC)    Elevated lipids    Flat feet    Gout    Hyperkalemia    Hypertension    Over weight     Family History: Non contributory to the present illness  Social History: Social History   Socioeconomic History   Marital status: Married    Spouse name: Not on file   Number of children: Not on file   Years of education: Not on file   Highest education level: Not on file  Occupational History   Not on file  Tobacco Use   Smoking status: Never   Smokeless tobacco: Never  Substance and Sexual Activity   Alcohol use: Not on file   Drug use: Not on file   Sexual activity: Not on file  Other Topics Concern  Not on file  Social History Narrative   Not on file   Social Determinants of Health   Financial Resource Strain: Not on file  Food Insecurity: Not on file  Transportation Needs: Not on file  Physical Activity: Not on file  Stress: Not on file  Social Connections: Not on file  Intimate Partner Violence: Not on file    Vital Signs: Blood pressure 124/70, pulse (!) 106, resp. rate 16, height '5\' 10"'  (1.778 m), weight 224 lb (101.6 kg), SpO2 98 %. Body mass index is 32.14 kg/m.    Examination: General Appearance: The patient is well-developed, well-nourished, and in no  distress. Neck Circumference: 40 cm Skin: Gross inspection of skin unremarkable. Head: normocephalic, no gross deformities. Eyes: no gross deformities noted. ENT: ears appear grossly normal Neurologic: Alert and oriented. No involuntary movements.    EPWORTH SLEEPINESS SCALE:  Scale:  (0)= no chance of dozing; (1)= slight chance of dozing; (2)= moderate chance of dozing; (3)= high chance of dozing  Chance  Situtation    Sitting and reading: 1    Watching TV: 1    Sitting Inactive in public: 0    As a passenger in car: 1      Lying down to rest: 0    Sitting and talking: 1    Sitting quielty after lunch: 1    In a car, stopped in traffic: 0   TOTAL SCORE:   5 out of 24    SLEEP STUDIES:  PSG 04/04/21 - overall AHI  16.1,  REM AHI 35.3,  Min SpO2 85%   CPAP COMPLIANCE DATA:  Date Range: 08/06/21  Average Daily Use: 4:09 hours  Median Use: 5:15 hours  Compliance for > 4 Hours: 62%  AHI: 0.5 respiratory events per hour  Days Used: 100/120  Mask Leak: 18.6 lpm  95th Percentile Pressure: 14.4 cmH2O   LABS: Recent Results (from the past 2160 hour(s))  Basic metabolic panel     Status: Abnormal   Collection Time: 09/07/21  8:25 AM  Result Value Ref Range   Glucose 96 70 - 99 mg/dL   BUN 21 8 - 27 mg/dL   Creatinine, Ser 1.83 (H) 0.76 - 1.27 mg/dL   eGFR 41 (L) >59 mL/min/1.73   BUN/Creatinine Ratio 11 10 - 24   Sodium 133 (L) 134 - 144 mmol/L   Potassium 5.0 3.5 - 5.2 mmol/L   Chloride 96 96 - 106 mmol/L   CO2 24 20 - 29 mmol/L   Calcium 9.6 8.6 - 10.2 mg/dL  CBC with Differential/Platelet     Status: Abnormal   Collection Time: 09/07/21  8:25 AM  Result Value Ref Range   WBC 7.0 3.4 - 10.8 x10E3/uL   RBC 5.91 (H) 4.14 - 5.80 x10E6/uL   Hemoglobin 15.2 13.0 - 17.7 g/dL   Hematocrit 47.3 37.5 - 51.0 %   MCV 80 79 - 97 fL   MCH 25.7 (L) 26.6 - 33.0 pg   MCHC 32.1 31.5 - 35.7 g/dL   RDW 14.7 11.6 - 15.4 %   Platelets 279 150 - 450 x10E3/uL    Neutrophils 71 Not Estab. %   Lymphs 8 Not Estab. %   Monocytes 17 Not Estab. %   Eos 3 Not Estab. %   Basos 0 Not Estab. %   Neutrophils Absolute 5.0 1.4 - 7.0 x10E3/uL   Lymphocytes Absolute 0.6 (L) 0.7 - 3.1 x10E3/uL   Monocytes Absolute 1.2 (H) 0.1 - 0.9 x10E3/uL  EOS (ABSOLUTE) 0.2 0.0 - 0.4 x10E3/uL   Basophils Absolute 0.0 0.0 - 0.2 x10E3/uL   Immature Granulocytes 1 Not Estab. %   Immature Grans (Abs) 0.0 0.0 - 0.1 x10E3/uL  Sedimentation rate     Status: Abnormal   Collection Time: 09/07/21  8:25 AM  Result Value Ref Range   Sed Rate 57 (H) 0 - 30 mm/hr  ANA w/Reflex if Positive     Status: None   Collection Time: 09/07/21  8:25 AM  Result Value Ref Range   Anti Nuclear Antibody (ANA) Negative Negative  POCT urinalysis dipstick     Status: Normal   Collection Time: 09/07/21  8:33 AM  Result Value Ref Range   Color, UA yellow    Clarity, UA clear    Glucose, UA Negative Negative   Bilirubin, UA negative    Ketones, UA negative    Spec Grav, UA 1.020 1.010 - 1.025   Blood, UA negative    pH, UA 5.0 5.0 - 8.0   Protein, UA Negative Negative   Urobilinogen, UA 0.2 0.2 or 1.0 E.U./dL   Nitrite, UA negative    Leukocytes, UA Negative Negative   Appearance normal    Odor    Sjogren's syndrome antibods(ssa + ssb)     Status: None   Collection Time: 10/30/21  9:27 AM  Result Value Ref Range   ENA SSA (RO) Ab 0.3 0.0 - 0.9 AI   ENA SSB (LA) Ab <0.2 0.0 - 0.9 AI  PSA     Status: Abnormal   Collection Time: 11/13/21  3:31 PM  Result Value Ref Range   Prostate Specific Ag, Serum 4.2 (H) 0.0 - 4.0 ng/mL    Comment: Roche ECLIA methodology. According to the American Urological Association, Serum PSA should decrease and remain at undetectable levels after radical prostatectomy. The AUA defines biochemical recurrence as an initial PSA value 0.2 ng/mL or greater followed by a subsequent confirmatory PSA value 0.2 ng/mL or greater. Values obtained with different assay  methods or kits cannot be used interchangeably. Results cannot be interpreted as absolute evidence of the presence or absence of malignant disease.     Radiology: MR PROSTATE W WO CONTRAST  Result Date: 10/02/2021 CLINICAL DATA:  Rising PSA with history of benign prostate biopsy in the past. EXAM: MR PROSTATE WITHOUT AND WITH CONTRAST TECHNIQUE: Multiplanar multisequence MRI images were obtained of the pelvis centered about the prostate. Pre and post contrast images were obtained. CONTRAST:  39m GADAVIST GADOBUTROL 1 MMOL/ML IV SOLN COMPARISON:  Comparison is made with examination from November of 2021. FINDINGS: Prostate: Peripheral zone: Diffuse low signal throughout the posterior prostate and corresponding dark appearance on ADC throughout the entire posterior peripheral zone. No focal area of restricted diffusion. The appearance is quite similar to prior imaging. Transitional zone: No signs of high-risk lesion in the transitional zone. Changes of BPH. Volume: 5.3 x 4.0 x 4.6 (volume = 51 cc) cm Transcapsular spread:  Absent Seminal vesicle involvement: Absent Neurovascular bundle involvement: Absent Pelvic adenopathy: Absent Bone metastasis: Absent Other findings: None IMPRESSION: Diffuse low signal throughout the posterior prostate and corresponding dark appearance on ADC throughout the entire posterior peripheral zone. The appearance is quite similar to prior imaging, more likely related to prostatitis. No evidence of high-risk lesion. Overall assessment PIRADS category 2. Occasionally diffuse cancer can present with this appearance though in light of prior imaging and recent biopsy results this is felt to be less likely. Electronically Signed  By: Zetta Bills M.D.   On: 10/02/2021 14:32    No results found.  No results found.    Assessment and Plan: Patient Active Problem List   Diagnosis Date Noted   OSA on CPAP 08/06/2021   CPAP use counseling 08/06/2021   OSA (obstructive sleep  apnea) 04/09/2021   Other hypersomnia 04/09/2021   Essential hypertension 06/08/2019   Type 2 diabetes mellitus with stage 3 chronic kidney disease, without long-term current use of insulin ([place]) 06/08/2019   Chronic kidney disease (CKD) stage G3a/A2, moderately decreased glomerular filtration rate (GFR) between 45-59 mL/min/1.73 square meter and albuminuria creatinine ratio between 30-299 mg/g 06/08/2019   Class 2 severe obesity due to excess calories with serious comorbidity and body mass index (BMI) of 38.0 to 38.9 in adult [facility]) 06/08/2019   Hypogonadism in male 06/08/2019   At risk for obstructive sleep apnea 06/08/2019   Other hyperlipidemia 06/08/2019   Persistent proteinuria 03/24/2018   1. OSA on CPAP The patient does  tolerate PAP and reports  benefit from PAP use. The patient was reminded how to clean equipment and advised to replace supplies routinely. The patient was also counselled on sleeping longer if possible. The compliance is poor but improving. . The AHI is 0.5.   OSA- increase use of pap./ f/u one year.    2. CPAP use counseling CPAP Counseling: had a lengthy discussion with the patient regarding the importance of PAP therapy in management of the sleep apnea. Patient appears to understand the risk factor reduction and also understands the risks associated with untreated sleep apnea. Patient will try to make a good faith effort to remain compliant with therapy. Also instructed the patient on proper cleaning of the device including the water must be changed daily if possible and use of distilled water is preferred. Patient understands that the machine should be regularly cleaned with appropriate recommended cleaning solutions that do not damage the PAP machine for example given white vinegar and water rinses. Other methods such as ozone treatment may not be as good as these simple methods to achieve cleaning.   3. Essential hypertension Hypertension Counseling:   The  following hypertensive lifestyle modification were recommended and discussed:  1. Limiting alcohol intake to less than 1 oz/day of ethanol:(24 oz of beer or 8 oz of wine or 2 oz of 100-proof whiskey). 2. Take baby ASA 81 mg daily. 3. Importance of regular aerobic exercise and losing weight. 4. Reduce dietary saturated fat and cholesterol intake for overall cardiovascular health. 5. Maintaining adequate dietary potassium, calcium, and magnesium intake. 6. Regular monitoring of the blood pressure. 7. Reduce sodium intake to less than 100 mmol/day (less than 2.3 gm of sodium or less than 6 gm of sodium choride)      The patient does  tolerate PAP and reports  benefit from PAP use. The patient was reminded how to clean equipment and advised to replace supplies routinely. The patient was also counselled on sleeping longer if possible. The compliance is poor but improving. . The AHI is 0.5.   OSA- increase use of pap./ f/u one year.   General Counseling: I have discussed the findings of the evaluation and examination with Airik.  I have also discussed any further diagnostic evaluation thatmay be needed or ordered today. Samuel verbalizes understanding of the findings of todays visit. We also reviewed his medications today and discussed drug interactions and side effects including but not limited excessive drowsiness and altered mental states. We also  discussed that there is always a risk not just to him but also people around him. he has been encouraged to call the office with any questions or concerns that should arise related to todays visit.  No orders of the defined types were placed in this encounter.       I have personally obtained a history, examined the patient, evaluated laboratory and imaging results, formulated the assessment and plan and placed orders. This patient was seen today by Tressie Ellis, PA-C in collaboration with Dr. Devona Konig.   Allyne Gee, MD [facility] Diplomate ABMS  Pulmonary Critical Care Medicine and Sleep Medicine

## 2021-12-06 DIAGNOSIS — R801 Persistent proteinuria, unspecified: Secondary | ICD-10-CM | POA: Diagnosis not present

## 2021-12-06 DIAGNOSIS — E875 Hyperkalemia: Secondary | ICD-10-CM | POA: Diagnosis not present

## 2021-12-06 DIAGNOSIS — J340 Abscess, furuncle and carbuncle of nose: Secondary | ICD-10-CM | POA: Diagnosis not present

## 2021-12-06 DIAGNOSIS — N1831 Chronic kidney disease, stage 3a: Secondary | ICD-10-CM | POA: Diagnosis not present

## 2021-12-06 DIAGNOSIS — R682 Dry mouth, unspecified: Secondary | ICD-10-CM | POA: Diagnosis not present

## 2021-12-06 DIAGNOSIS — I10 Essential (primary) hypertension: Secondary | ICD-10-CM | POA: Diagnosis not present

## 2021-12-06 DIAGNOSIS — Z6832 Body mass index (BMI) 32.0-32.9, adult: Secondary | ICD-10-CM | POA: Diagnosis not present

## 2021-12-07 ENCOUNTER — Ambulatory Visit: Payer: Self-pay

## 2021-12-07 ENCOUNTER — Other Ambulatory Visit: Payer: Self-pay

## 2021-12-07 DIAGNOSIS — E291 Testicular hypofunction: Secondary | ICD-10-CM

## 2021-12-07 NOTE — Progress Notes (Signed)
Pt received bi weekly testosterone inj. ? ?Left DEL ?

## 2021-12-09 ENCOUNTER — Encounter: Payer: Self-pay | Admitting: Urology

## 2021-12-21 ENCOUNTER — Ambulatory Visit: Payer: 59

## 2021-12-30 DIAGNOSIS — G4733 Obstructive sleep apnea (adult) (pediatric): Secondary | ICD-10-CM | POA: Diagnosis not present

## 2022-01-03 ENCOUNTER — Ambulatory Visit: Payer: Self-pay

## 2022-01-03 DIAGNOSIS — E291 Testicular hypofunction: Secondary | ICD-10-CM

## 2022-01-03 NOTE — Progress Notes (Signed)
Pt received bi-weekly injection testosterone  

## 2022-01-16 ENCOUNTER — Other Ambulatory Visit: Payer: Self-pay

## 2022-01-16 DIAGNOSIS — R972 Elevated prostate specific antigen [PSA]: Secondary | ICD-10-CM

## 2022-01-16 DIAGNOSIS — E291 Testicular hypofunction: Secondary | ICD-10-CM

## 2022-01-18 ENCOUNTER — Ambulatory Visit: Payer: Self-pay

## 2022-01-18 DIAGNOSIS — E291 Testicular hypofunction: Secondary | ICD-10-CM

## 2022-01-20 ENCOUNTER — Other Ambulatory Visit: Payer: Self-pay | Admitting: Physician Assistant

## 2022-01-20 DIAGNOSIS — N1831 Chronic kidney disease, stage 3a: Secondary | ICD-10-CM

## 2022-01-29 ENCOUNTER — Other Ambulatory Visit: Payer: 59

## 2022-01-29 DIAGNOSIS — E291 Testicular hypofunction: Secondary | ICD-10-CM | POA: Diagnosis not present

## 2022-01-29 DIAGNOSIS — R972 Elevated prostate specific antigen [PSA]: Secondary | ICD-10-CM

## 2022-01-29 DIAGNOSIS — G4733 Obstructive sleep apnea (adult) (pediatric): Secondary | ICD-10-CM | POA: Diagnosis not present

## 2022-01-30 ENCOUNTER — Encounter: Payer: Self-pay | Admitting: *Deleted

## 2022-01-30 LAB — TESTOSTERONE: Testosterone: 469 ng/dL (ref 264–916)

## 2022-01-30 LAB — PSA: Prostate Specific Ag, Serum: 3.9 ng/mL (ref 0.0–4.0)

## 2022-01-30 LAB — HEMATOCRIT: Hematocrit: 45.7 % (ref 37.5–51.0)

## 2022-02-01 ENCOUNTER — Ambulatory Visit: Payer: Self-pay

## 2022-02-01 DIAGNOSIS — E291 Testicular hypofunction: Secondary | ICD-10-CM

## 2022-02-01 MED ORDER — TESTOSTERONE CYPIONATE 200 MG/ML IM SOLN
200.0000 mg | INTRAMUSCULAR | Status: DC
  Administered 2022-02-01 – 2022-03-08 (×3): 200 mg via INTRAMUSCULAR

## 2022-02-12 ENCOUNTER — Other Ambulatory Visit: Payer: Self-pay

## 2022-02-12 NOTE — Telephone Encounter (Signed)
[name] called [facility] requesting Rx refill for Metformin. ? ?Pharmacy sent electronic refill request for Metformin to [facility] ED. ? ?Re-routed Rx refill request to ?Nona Dell, PA-C. ? ?[facility] ?

## 2022-02-22 ENCOUNTER — Ambulatory Visit: Payer: Self-pay

## 2022-02-22 DIAGNOSIS — E291 Testicular hypofunction: Secondary | ICD-10-CM

## 2022-02-22 NOTE — Progress Notes (Signed)
Pt received testosterone injection.

## 2022-03-08 ENCOUNTER — Ambulatory Visit: Payer: Self-pay

## 2022-03-08 DIAGNOSIS — E291 Testicular hypofunction: Secondary | ICD-10-CM

## 2022-03-08 NOTE — Progress Notes (Signed)
Pt received bi weekly testosterone injection.  

## 2022-03-13 ENCOUNTER — Ambulatory Visit: Payer: Self-pay

## 2022-03-13 DIAGNOSIS — E291 Testicular hypofunction: Secondary | ICD-10-CM

## 2022-03-13 DIAGNOSIS — Z0000 Encounter for general adult medical examination without abnormal findings: Secondary | ICD-10-CM

## 2022-03-13 LAB — POCT URINALYSIS DIPSTICK
Bilirubin, UA: NEGATIVE
Blood, UA: NEGATIVE
Glucose, UA: NEGATIVE
Ketones, UA: NEGATIVE
Leukocytes, UA: NEGATIVE
Nitrite, UA: NEGATIVE
Protein, UA: NEGATIVE
Spec Grav, UA: >=1.03 — AB (ref 1.010–1.025)
Urobilinogen, UA: 0.2 E.U./dL
pH, UA: 5.5 (ref 5.0–8.0)

## 2022-03-13 NOTE — Progress Notes (Signed)
Pt presents today for physical labs, will return to clinic for scheduled physical.  

## 2022-03-18 LAB — CMP12+LP+TP+TSH+6AC+PSA+CBC…
ALT: 14 IU/L (ref 0–44)
AST: 17 IU/L (ref 0–40)
Albumin/Globulin Ratio: 1.4 (ref 1.2–2.2)
Albumin: 4 g/dL (ref 3.8–4.8)
Alkaline Phosphatase: 64 IU/L (ref 44–121)
BUN/Creatinine Ratio: 13 (ref 10–24)
BUN: 21 mg/dL (ref 8–27)
Basophils Absolute: 0 10*3/uL (ref 0.0–0.2)
Basos: 0 %
Bilirubin Total: 0.5 mg/dL (ref 0.0–1.2)
Calcium: 9.3 mg/dL (ref 8.6–10.2)
Chloride: 100 mmol/L (ref 96–106)
Chol/HDL Ratio: 5 ratio (ref 0.0–5.0)
Cholesterol, Total: 204 mg/dL — ABNORMAL HIGH (ref 100–199)
Creatinine, Ser: 1.62 mg/dL — ABNORMAL HIGH (ref 0.76–1.27)
EOS (ABSOLUTE): 0.1 10*3/uL (ref 0.0–0.4)
Eos: 1 %
Estimated CHD Risk: 1 times avg. (ref 0.0–1.0)
Free Thyroxine Index: 2.2 (ref 1.2–4.9)
GGT: 40 IU/L (ref 0–65)
Globulin, Total: 2.9 g/dL (ref 1.5–4.5)
Glucose: 74 mg/dL (ref 70–99)
HDL: 41 mg/dL (ref >39)
Hematocrit: 47.5 % (ref 37.5–51.0)
Hemoglobin: 15.4 g/dL (ref 13.0–17.7)
Immature Grans (Abs): 0 10*3/uL (ref 0.0–0.1)
Immature Granulocytes: 0 %
Iron: 51 ug/dL (ref 38–169)
LDH: 157 IU/L (ref 121–224)
LDL Chol Calc (NIH): 148 mg/dL — ABNORMAL HIGH (ref 0–99)
Lymphocytes Absolute: 0.9 10*3/uL (ref 0.7–3.1)
Lymphs: 12 %
MCH: 26.5 pg — ABNORMAL LOW (ref 26.6–33.0)
MCHC: 32.4 g/dL (ref 31.5–35.7)
MCV: 82 fL (ref 79–97)
Monocytes Absolute: 1 10*3/uL — ABNORMAL HIGH (ref 0.1–0.9)
Monocytes: 12 %
Neutrophils Absolute: 5.8 10*3/uL (ref 1.4–7.0)
Neutrophils: 75 %
Phosphorus: 3.9 mg/dL (ref 2.8–4.1)
Platelets: 241 10*3/uL (ref 150–450)
Potassium: 5.1 mmol/L (ref 3.5–5.2)
Prostate Specific Ag, Serum: 3.3 ng/mL (ref 0.0–4.0)
RBC: 5.82 x10E6/uL — ABNORMAL HIGH (ref 4.14–5.80)
RDW: 15.1 % (ref 11.6–15.4)
Sodium: 138 mmol/L (ref 134–144)
T3 Uptake Ratio: 31 % (ref 24–39)
T4, Total: 7.1 ug/dL (ref 4.5–12.0)
TSH: 1.52 u[IU]/mL (ref 0.450–4.500)
Total Protein: 6.9 g/dL (ref 6.0–8.5)
Triglycerides: 85 mg/dL (ref 0–149)
Uric Acid: 7.2 mg/dL (ref 3.8–8.4)
VLDL Cholesterol Cal: 15 mg/dL (ref 5–40)
WBC: 7.8 10*3/uL (ref 3.4–10.8)
eGFR: 47 mL/min/{1.73_m2} — ABNORMAL LOW (ref >59)

## 2022-03-18 LAB — TESTOSTERONE,FREE AND TOTAL
Testosterone, Free: 33.3 pg/mL — ABNORMAL HIGH (ref 6.6–18.1)
Testosterone: 1264 ng/dL — ABNORMAL HIGH (ref 264–916)

## 2022-03-18 LAB — MICROALBUMIN / CREATININE URINE RATIO

## 2022-03-18 LAB — HGB A1C W/O EAG: Hgb A1c MFr Bld: 5.9 % — ABNORMAL HIGH (ref 4.8–5.6)

## 2022-03-20 ENCOUNTER — Ambulatory Visit: Payer: Self-pay | Admitting: Physician Assistant

## 2022-03-20 ENCOUNTER — Encounter: Payer: Self-pay | Admitting: Physician Assistant

## 2022-03-20 VITALS — BP 113/78 | HR 88 | Temp 97.3°F | Resp 12 | Ht 70.0 in | Wt 216.0 lb

## 2022-03-20 DIAGNOSIS — E7849 Other hyperlipidemia: Secondary | ICD-10-CM

## 2022-03-20 DIAGNOSIS — Z0000 Encounter for general adult medical examination without abnormal findings: Secondary | ICD-10-CM

## 2022-03-20 DIAGNOSIS — N1831 Chronic kidney disease, stage 3a: Secondary | ICD-10-CM

## 2022-03-20 DIAGNOSIS — Z1211 Encounter for screening for malignant neoplasm of colon: Secondary | ICD-10-CM

## 2022-03-20 DIAGNOSIS — E1122 Type 2 diabetes mellitus with diabetic chronic kidney disease: Secondary | ICD-10-CM

## 2022-03-20 NOTE — Addendum Note (Signed)
Addended by: Gardner Candle on: 03/20/2022 02:27 PM   Modules accepted: Orders

## 2022-03-20 NOTE — Progress Notes (Addendum)
[facility]  ____________________________________________   None    (approximate)  I have reviewed the triage vital signs and the nursing notes.   HISTORY  Chief Complaint Annual Exam    HPI Ernest [name] is a 66 y.o. male patient presents for annual physical exam.  Patient was no concerns or complaints.         Past Medical History:  Diagnosis Date   CKD (chronic kidney disease)    Colon polyps    Diabetes mellitus without complication (HCC)    Elevated lipids    Flat feet    Gout    Hyperkalemia    Hypertension    Over weight     Patient Active Problem List   Diagnosis Date Noted   OSA on CPAP 08/06/2021   CPAP use counseling 08/06/2021   OSA (obstructive sleep apnea) 04/09/2021   Other hypersomnia 04/09/2021   Essential hypertension 06/08/2019   Type 2 diabetes mellitus with stage 3 chronic kidney disease, without long-term current use of insulin ([place]) 06/08/2019   Chronic kidney disease (CKD) stage G3a/A2, moderately decreased glomerular filtration rate (GFR) between 45-59 mL/min/1.73 square meter and albuminuria creatinine ratio between 30-299 mg/g 06/08/2019   Class 2 severe obesity due to excess calories with serious comorbidity and body mass index (BMI) of 38.0 to 38.9 in adult [facility]) 06/08/2019   Hypogonadism in male 06/08/2019   At risk for obstructive sleep apnea 06/08/2019   Other hyperlipidemia 06/08/2019   Persistent proteinuria 03/24/2018    Past Surgical History:  Procedure Laterality Date   COLONOSCOPY     HERNIA REPAIR      Prior to Admission medications   Medication Sig Start Date End Date Taking? Authorizing Provider  aspirin EC 81 MG tablet Take by mouth.    [provider]  atorvastatin (LIPITOR) 40 MG tablet TAKE 1 TABLET BY MOUTH ONCE DAILY. DISCONTINUE ATORVASTATIN 20MG. 01/25/19   [provider]  chlorthalidone (HYGROTON) 25 MG tablet Take 1/2 tablet by mouth every morning  05/10/19   Towanda Malkin, MD  glipiZIDE (GLUCOTROL XL) 5 MG 24 hr tablet Take 1 tablet (5 mg total) by mouth daily. 04/03/21   Sable Feil, PA-C  liraglutide (VICTOZA) 18 MG/3ML SOPN 1.2 mg SQ daily 04/26/21   Sable Feil, PA-C  lisinopril (ZESTRIL) 10 MG tablet Take 1 tablet (10 mg total) by mouth daily. 08/25/19   Betancourt, Aura Fey, NP  metFORMIN (GLUCOPHAGE) 1000 MG tablet Take 1 tablet (1,000 mg total) by mouth 2 (two) times daily with a meal. 02/12/22   Sable Feil, PA-C  testosterone cypionate (DEPOTESTOSTERONE CYPIONATE) 200 MG/ML injection INJECT 1 ML INTRAMUSCULARLY EVERY 14 DAYS 11/19/21   Stoioff, Ronda Fairly, MD    Allergies Patient has no known allergies.  Family History  Problem Relation Age of Onset   Diabetes Mother    Diabetes Paternal Grandmother    Hypertension Paternal Grandmother    Diabetes Brother    Blindness Brother        due to DM    Social History Social History   Tobacco Use   Smoking status: Never   Smokeless tobacco: Never    Review of Systems Constitutional: No fever/chills Eyes: No visual changes. ENT: No sore throat. Cardiovascular: Denies chest pain. Respiratory: Denies shortness of breath. Gastrointestinal: No abdominal pain.  No nausea, no vomiting.  No diarrhea.  No constipation. Genitourinary: Negative for dysuria. Musculoskeletal: Negative for back pain. Skin: Negative for  rash. Neurological: Negative for headaches, focal weakness or numbness. Endocrine: Chronic kidney disease, diabetes, hyperlipidemia, and hypertension.  ____________________________________________   PHYSICAL EXAM:  VITAL SIGNS: BP is 113/78, pulse 88, respiration 12, temperature 97.3, patient 90% O2 sat on room air.  Patient weighs 216 pounds and BMI is 30.99. Constitutional: Alert and oriented. Well appearing and in no acute distress. Eyes: Conjunctivae are normal. PERRL. EOMI. Head: Atraumatic. Nose: No congestion/rhinnorhea. Mouth/Throat:  Mucous membranes are moist.  Oropharynx non-erythematous. Neck: No stridor.  No cervical spine tenderness to palpation. Hematological/Lymphatic/Immunilogical: No cervical lymphadenopathy. Cardiovascular: Normal rate, regular rhythm. Grossly normal heart sounds.  Good peripheral circulation. Respiratory: Normal respiratory effort.  No retractions. Lungs CTAB. Gastrointestinal: Soft and nontender. No distention. No abdominal bruits. No CVA tenderness. Genitourinary: Deferred Musculoskeletal: No lower extremity tenderness nor edema.  No joint effusions. Neurologic:  Normal speech and language. No gross focal neurologic deficits are appreciated.  Patient also neurologically intact with diabetic foot exam.  No gait instability. Skin:  Skin is warm, dry and intact. No rash noted.  Mild hypertrophic toe nails. Psychiatric: Mood and affect are normal. Speech and behavior are normal.  ____________________________________________   LABS       Component Ref Range & Units 7 d ago 6 mo ago 1 yr ago  Color, UA  dark yellow  yellow  yellow   Clarity, UA  clear  clear  clear   Glucose, UA Negative Negative  Negative  Negative   Bilirubin, UA  negative  negative  negative   Ketones, UA  negative  negative  +-   Spec Grav, UA 1.010 - 1.025 >=1.030 Abnormal   1.020  1.020   Blood, UA  negative  negative  negative   pH, UA 5.0 - 8.0 5.5  5.0  6.0   Protein, UA Negative Negative  Negative  Negative   Urobilinogen, UA 0.2 or 1.0 E.U./dL 0.2  0.2  0.2   Nitrite, UA  negative  negative  negative   Leukocytes, UA Negative Negative  Negative  Negative   Appearance   normal  light   Odor                        Other Results from 03/13/2022   Contains abnormal data Hgb A1c w/o eAG Order: [id number] Status: Final result    Visible to patient: Yes (seen)    Next appt: 03/22/2022 at 11:00 AM in [facility] ([facility])    Dx: Routine adult health maintenance    0 Result Notes   1 HM Topic            Component Ref Range & Units 7 d ago (03/13/22) 8 mo ago (07/12/21) 1 yr ago (03/14/21) 1 yr ago (11/08/20) 2 yr ago (02/18/20) 2 yr ago (10/21/19) 2 yr ago (06/08/19)  Hgb A1c MFr Bld 4.8 - 5.6 % 5.9 High   6.3 High  CM  7.8 High  CM  7.8 High  CM  6.7 High  CM  6.7 High  CM  6.6 High  CM   Comment:          Prediabetes: 5.7 - 6.4           Diabetes: >6.4           Glycemic control for adults with diabetes: <7.0   Resulting Agency  _0  [facility]         Narrative Performed by: Maryan Puls Performed  at:  [address]  [address], [place], [place]  [id number]  Lab Director: Rush Farmer MD, Phone:  [phone]    Specimen Collected: 03/13/22 08:59 Last Resulted: 03/18/22 08:09         View All Conversations on this Encounter      CM=Additional comments      Result Care Coordination   Patient Communication   Add Comments   Seen Back to Top      Satisfied Health Maintenance Topics     Back to Top HEMOGLOBIN A1C (Every 6 Months)  Next due on 09/12/2022  Address Topic          Contains abnormal data CMP12+LP+TP+TSH+6AC+PSA+CBC. Order: [id number] Status: Final result    Visible to patient: Yes (seen)    Next appt: 03/22/2022 at 11:00 AM in [facility] ([facility])    Dx: Routine adult health maintenance    0 Result Notes            Component Ref Range & Units 7 d ago (03/13/22) 1 mo ago (01/29/22) 1 mo ago (01/29/22) 4 mo ago (11/13/21) 6 mo ago (09/07/21) 6 mo ago (09/07/21) 7 mo ago (07/27/21) 7 mo ago (07/27/21)  Glucose 70 - 99 mg/dL 74      96     Uric Acid 3.8 - 8.4 mg/dL 7.2          Comment:            Therapeutic target for gout patients: <6.0  BUN 8 - 27 mg/dL 21      21     Creatinine, Ser 0.76 - 1.27 mg/dL 1.62 High       1.83 High      eGFR >59 mL/min/1.73 47 Low       41 Low      BUN/Creatinine Ratio 10 - _0 Sodium 134 - 144 mmol/L 138      133 Low      Potassium 3.5 - 5.2 mmol/L 5.1       5.0     Chloride 96 - 106 mmol/L 100      96     Calcium 8.6 - 10.2 mg/dL 9.3      9.6     Phosphorus 2.8 - 4.1 mg/dL 3.9          Total Protein 6.0 - 8.5 g/dL 6.9          Albumin 3.8 - 4.8 g/dL 4.0          Globulin, Total 1.5 - 4.5 g/dL 2.9          Albumin/Globulin Ratio 1.2 - 2.2 1.4          Bilirubin Total 0.0 - 1.2 mg/dL 0.5          Alkaline Phosphatase 44 - 121 IU/L 64          LDH 121 - 224 IU/L 157          AST 0 - 40 IU/L 17          ALT 0 - 44 IU/L 14          GGT 0 - 65 IU/L 40          Iron 38 - 169 ug/dL 51          Cholesterol, Total 100 - 199 mg/dL 204 High  Triglycerides 0 - 149 mg/dL 85          HDL >39 mg/dL 41          VLDL Cholesterol Cal 5 - 40 mg/dL 15          LDL Chol Calc (NIH) 0 - 99 mg/dL 148 High           Chol/HDL Ratio 0.0 - 5.0 ratio 5.0          Comment:                                   T. Chol/HDL Ratio                                              Men  Women                                1/2 Avg.Risk  3.4    3.3                                    Avg.Risk  5.0    4.4                                 2X Avg.Risk  9.6    7.1                                 3X Avg.Risk 23.4   11.0   Estimated CHD Risk 0.0 - 1.0 times avg. 1.0          Comment: The CHD Risk is based on the T. Chol/HDL ratio. Other  factors affect CHD Risk such as hypertension, smoking,  diabetes, severe obesity, and family history of  premature CHD.   TSH 0.450 - 4.500 uIU/mL 1.520          T4, Total 4.5 - 12.0 ug/dL 7.1          T3 Uptake Ratio 24 - 39 % 31          Free Thyroxine Index 1.2 - 4.9 2.2          Prostate Specific Ag, Serum 0.0 - 4.0 ng/mL 3.3  3.9 CM   4.2 High  CM    5.4 High  CM    Comment: Roche ECLIA methodology.  According to the American Urological Association, Serum PSA should  decrease and remain at undetectable levels after radical  prostatectomy. The AUA defines biochemical recurrence as an initial  PSA value 0.2 ng/mL or greater followed  by a subsequent confirmatory  PSA value 0.2 ng/mL or greater.  Values obtained with different assay methods or kits cannot be used  interchangeably. Results cannot be interpreted as absolute evidence  of the presence or absence of malignant disease.   WBC 3.4 - 10.8 x10E3/uL 7.8     7.0      RBC 4.14 - 5.80 x10E6/uL 5.82 High      5.91 High       Hemoglobin 13.0 - 17.7 g/dL 15.4  15.2      Hematocrit 37.5 - 51.0 % 47.5   45.7   47.3    46.8   MCV 79 - 97 fL 82     80      MCH 26.6 - 33.0 pg 26.5 Low      25.7 Low       MCHC 31.5 - 35.7 g/dL 32.4     32.1      RDW 11.6 - 15.4 % 15.1     14.7      Platelets 150 - 450 x10E3/uL 241     279      Neutrophils Not Estab. % 75     71      Lymphs Not Estab. % 12     8      Monocytes Not Estab. % 12     17      Eos Not Estab. % 1     3      Basos Not Estab. % 0     0      Neutrophils Absolute 1.4 - 7.0 x10E3/uL 5.8     5.0      Lymphocytes Absolute 0.7 - 3.1 x10E3/uL 0.9     0.6 Low       Monocytes Absolute 0.1 - 0.9 x10E3/uL 1.0 High      1.2 High       EOS (ABSOLUTE) 0.0 - 0.4 x10E3/uL 0.1     0.2      Basophils Absolute 0.0 - 0.2 x10E3/uL 0.0     0.0      Immature Granulocytes Not Estab. % 0     1      Immature Grans (              ____________________________________________  EKG  Normal sinus rhythm at 95 bpm ____________________________________________    ____________________________________________   INITIAL IMPRESSION / ASSESSMENT AND PLAN As part of my medical decision making, I reviewed the following data within the [place]      Discussed lab and niece EKG results with patient.  Slight rise in cholesterol to 204.  Triglycerides and HDL within normal range.  Advise diet and exercise.  Continue previous medicines.  Patient is due for colonoscopy.          ____________________________________________   FINAL CLINICAL IMPRESSION  Well exam   ED Discharge Orders     None        Note:   This document was prepared using Dragon voice recognition software and may include unintentional dictation errors.

## 2022-03-21 ENCOUNTER — Other Ambulatory Visit: Payer: Self-pay | Admitting: Urology

## 2022-03-22 ENCOUNTER — Ambulatory Visit: Payer: 59

## 2022-03-24 ENCOUNTER — Other Ambulatory Visit: Payer: Self-pay | Admitting: Physician Assistant

## 2022-03-27 ENCOUNTER — Other Ambulatory Visit: Payer: Self-pay

## 2022-03-27 DIAGNOSIS — E1122 Type 2 diabetes mellitus with diabetic chronic kidney disease: Secondary | ICD-10-CM

## 2022-03-27 MED ORDER — VICTOZA 18 MG/3ML ~~LOC~~ SOPN: PEN_INJECTOR | SUBCUTANEOUS | 3 refills | Status: DC

## 2022-04-05 ENCOUNTER — Ambulatory Visit: Payer: Self-pay

## 2022-04-05 DIAGNOSIS — E291 Testicular hypofunction: Secondary | ICD-10-CM

## 2022-04-05 MED ORDER — TESTOSTERONE CYPIONATE 200 MG/ML IM SOLN
200.0000 mg | INTRAMUSCULAR | Status: AC
  Administered 2022-04-05 – 2022-09-20 (×10): 200 mg via INTRAMUSCULAR

## 2022-04-05 NOTE — Progress Notes (Signed)
Pt received bi weekly injections.

## 2022-04-19 ENCOUNTER — Ambulatory Visit: Payer: Self-pay

## 2022-04-19 DIAGNOSIS — B351 Tinea unguium: Secondary | ICD-10-CM | POA: Diagnosis not present

## 2022-04-19 DIAGNOSIS — E119 Type 2 diabetes mellitus without complications: Secondary | ICD-10-CM | POA: Diagnosis not present

## 2022-04-19 DIAGNOSIS — M898X9 Other specified disorders of bone, unspecified site: Secondary | ICD-10-CM | POA: Diagnosis not present

## 2022-04-19 DIAGNOSIS — E291 Testicular hypofunction: Secondary | ICD-10-CM

## 2022-04-19 DIAGNOSIS — M19072 Primary osteoarthritis, left ankle and foot: Secondary | ICD-10-CM | POA: Diagnosis not present

## 2022-04-19 DIAGNOSIS — L600 Ingrowing nail: Secondary | ICD-10-CM | POA: Diagnosis not present

## 2022-04-19 NOTE — Progress Notes (Signed)
Pt received bi-weekly injection. 

## 2022-04-20 ENCOUNTER — Other Ambulatory Visit: Payer: Self-pay | Admitting: Physician Assistant

## 2022-04-20 DIAGNOSIS — N1831 Chronic kidney disease, stage 3a: Secondary | ICD-10-CM

## 2022-04-25 ENCOUNTER — Other Ambulatory Visit: Payer: Self-pay | Admitting: Family Medicine

## 2022-04-25 DIAGNOSIS — R972 Elevated prostate specific antigen [PSA]: Secondary | ICD-10-CM

## 2022-04-29 ENCOUNTER — Other Ambulatory Visit: Payer: Self-pay

## 2022-04-29 DIAGNOSIS — N1831 Chronic kidney disease, stage 3a: Secondary | ICD-10-CM

## 2022-04-29 NOTE — Telephone Encounter (Signed)
Received a fax Rx refill request for Glipizide 5mg  ER from [facility].  Upon entering Rx refill info into Epic, a box opened up for a pended refill request for Glipizide 5 mg ER than was sent electronically by pharmacy.  Re-routed the pended Rx refill request to [employer] [place], PA-C.  AMD

## 2022-05-02 ENCOUNTER — Other Ambulatory Visit: Payer: 59

## 2022-05-02 DIAGNOSIS — R972 Elevated prostate specific antigen [PSA]: Secondary | ICD-10-CM | POA: Diagnosis not present

## 2022-05-03 ENCOUNTER — Ambulatory Visit: Payer: Self-pay

## 2022-05-03 ENCOUNTER — Encounter: Payer: Self-pay | Admitting: Urology

## 2022-05-03 DIAGNOSIS — E291 Testicular hypofunction: Secondary | ICD-10-CM

## 2022-05-03 LAB — PSA: Prostate Specific Ag, Serum: 3.1 ng/mL (ref 0.0–4.0)

## 2022-05-03 NOTE — Progress Notes (Signed)
Pt presents today for bi-weekly injection testosterone.

## 2022-05-17 ENCOUNTER — Ambulatory Visit: Payer: Self-pay

## 2022-05-17 DIAGNOSIS — E291 Testicular hypofunction: Secondary | ICD-10-CM

## 2022-05-17 NOTE — Progress Notes (Signed)
Pt received bi weekly testosterone injection.  

## 2022-05-31 ENCOUNTER — Ambulatory Visit: Payer: 59

## 2022-06-14 ENCOUNTER — Ambulatory Visit: Payer: Self-pay

## 2022-06-14 DIAGNOSIS — E291 Testicular hypofunction: Secondary | ICD-10-CM

## 2022-06-17 DIAGNOSIS — M7672 Peroneal tendinitis, left leg: Secondary | ICD-10-CM | POA: Diagnosis not present

## 2022-06-17 DIAGNOSIS — E119 Type 2 diabetes mellitus without complications: Secondary | ICD-10-CM | POA: Diagnosis not present

## 2022-06-28 ENCOUNTER — Ambulatory Visit: Payer: Self-pay

## 2022-06-28 DIAGNOSIS — E291 Testicular hypofunction: Secondary | ICD-10-CM

## 2022-07-19 ENCOUNTER — Other Ambulatory Visit: Payer: Self-pay | Admitting: *Deleted

## 2022-07-19 DIAGNOSIS — R972 Elevated prostate specific antigen [PSA]: Secondary | ICD-10-CM

## 2022-07-19 DIAGNOSIS — E291 Testicular hypofunction: Secondary | ICD-10-CM

## 2022-07-26 ENCOUNTER — Other Ambulatory Visit: Payer: Self-pay | Admitting: Physician Assistant

## 2022-07-26 DIAGNOSIS — N1831 Chronic kidney disease, stage 3a: Secondary | ICD-10-CM

## 2022-07-29 ENCOUNTER — Other Ambulatory Visit: Payer: Self-pay

## 2022-07-29 ENCOUNTER — Ambulatory Visit: Payer: Self-pay

## 2022-07-29 DIAGNOSIS — E291 Testicular hypofunction: Secondary | ICD-10-CM

## 2022-07-29 DIAGNOSIS — N1831 Chronic kidney disease, stage 3a: Secondary | ICD-10-CM

## 2022-07-29 MED ORDER — VICTOZA 18 MG/3ML ~~LOC~~ SOPN: PEN_INJECTOR | SUBCUTANEOUS | 3 refills | Status: DC

## 2022-07-29 NOTE — Progress Notes (Unsigned)
Pt received bi-weekly injection. Testosterone.  R del.

## 2022-08-01 ENCOUNTER — Other Ambulatory Visit: Payer: 59

## 2022-08-01 DIAGNOSIS — E291 Testicular hypofunction: Secondary | ICD-10-CM

## 2022-08-01 DIAGNOSIS — R972 Elevated prostate specific antigen [PSA]: Secondary | ICD-10-CM

## 2022-08-02 LAB — TESTOSTERONE: Testosterone: 863 ng/dL (ref 264–916)

## 2022-08-02 LAB — PSA: Prostate Specific Ag, Serum: 1.8 ng/mL (ref 0.0–4.0)

## 2022-08-02 LAB — HEMATOCRIT: Hematocrit: 46.4 % (ref 37.5–51.0)

## 2022-08-05 ENCOUNTER — Ambulatory Visit: Payer: 59 | Admitting: Urology

## 2022-08-07 ENCOUNTER — Ambulatory Visit (INDEPENDENT_AMBULATORY_CARE_PROVIDER_SITE_OTHER): Payer: 59 | Admitting: Urology

## 2022-08-07 ENCOUNTER — Encounter: Payer: Self-pay | Admitting: Urology

## 2022-08-07 VITALS — BP 120/77 | HR 103 | Ht 70.0 in | Wt 212.0 lb

## 2022-08-07 DIAGNOSIS — R972 Elevated prostate specific antigen [PSA]: Secondary | ICD-10-CM | POA: Diagnosis not present

## 2022-08-07 DIAGNOSIS — E291 Testicular hypofunction: Secondary | ICD-10-CM | POA: Diagnosis not present

## 2022-08-07 DIAGNOSIS — Z87898 Personal history of other specified conditions: Secondary | ICD-10-CM

## 2022-08-07 NOTE — Progress Notes (Unsigned)
08/07/2022 11:25 AM   Ernest [name] 1956/06/20 [id number]  Referring provider: Joni Reining, PA-C [address] [place],  [place] [address]  Chief Complaint  Patient presents with   Hypogonadism    Urologic history:  1.  Hypogonadism Symptoms tiredness, fatigue and decreased libido TRT testosterone cypionate 200 mg every 2 weeks with symptom improvement  2.  Abnormal PSA velocity Prostate biopsy 10/20/2020 PSA 3.3 70 g prostate; benign pathology MRI 09/28/2021 PSA bump 5.4 with 51 cc gland; PI-RADS 2 findings consistent with prostatitis   HPI: 66 y.o. male presents for annual follow-up.  Doing well since last visit No bothersome LUTS Denies dysuria, gross hematuria Denies flank, abdominal or pelvic pain Remains on TRT with good energy and libido Labs 08/01/2022: Testosterone 863 ng/dL, PSA 1.8, hematocrit 24.4 Last injection at time of lab visit was 1 week prior   PMH: Past Medical History:  Diagnosis Date   CKD (chronic kidney disease)    Colon polyps    Diabetes mellitus without complication (HCC)    Elevated lipids    Flat feet    Gout    Hyperkalemia    Hypertension    Over weight     Surgical History: Past Surgical History:  Procedure Laterality Date   COLONOSCOPY     HERNIA REPAIR      Home Medications:  Allergies as of 08/07/2022   No Known Allergies      Medication List        Accurate as of August 07, 2022 11:25 AM. If you have any questions, ask your nurse or doctor.          aspirin EC 81 MG tablet Take by mouth.   atorvastatin 40 MG tablet Commonly known as: LIPITOR TAKE 1 TABLET BY MOUTH ONCE DAILY. DISCONTINUE ATORVASTATIN 20MG .   chlorthalidone 25 MG tablet Commonly known as: HYGROTON Take 1/2 tablet by mouth every morning   glipiZIDE 5 MG 24 hr tablet Commonly known as: GLUCOTROL XL Take 1 tablet (5 mg total) by mouth daily.   lisinopril 10 MG tablet Commonly known as: ZESTRIL Take 1 tablet (10 mg  total) by mouth daily.   metFORMIN 1000 MG tablet Commonly known as: GLUCOPHAGE Take 1 tablet (1,000 mg total) by mouth 2 (two) times daily with a meal.   testosterone cypionate 200 MG/ML injection Commonly known as: DEPOTESTOSTERONE CYPIONATE INJECT INTO THE MUSCLE EVERY 14 DAYS   Victoza 18 MG/3ML Sopn Generic drug: liraglutide 1.2 mg SQ daily        Allergies: No Known Allergies  Family History: Family History  Problem Relation Age of Onset   Diabetes Mother    Diabetes Paternal Grandmother    Hypertension Paternal Grandmother    Diabetes Brother    Blindness Brother        due to DM    Social History:  reports that he has never smoked. He has never used smokeless tobacco. No history on file for alcohol use and drug use.   Physical Exam: BP 120/77   Pulse (!) 103   Ht 5\' 10"  (1.778 m)   Wt 212 lb (96.2 kg)   BMI 30.42 kg/m   Constitutional:  Alert and oriented, No acute distress. HEENT: [place] AT, moist mucus membranes.  Trachea midline, no masses. Cardiovascular: No clubbing, cyanosis, or edema. Respiratory: Normal respiratory effort, no increased work of breathing. GI: Abdomen is soft, nontender, nondistended, no abdominal masses GU: No CVA tenderness Skin: No rashes, bruises or  suspicious lesions. Neurologic: Grossly intact, no focal deficits, moving all 4 extremities. Psychiatric: Normal mood and affect.   Assessment & Plan:    1.  Hypogonadism Stable on TRT Labs in 6 months PSA, testosterone, hematocrit Office visit 1 year PSA, testosterone, hematocrit and DRE  2.  History elevated PSA PSA 1.8   Riki Altes, MD  [facility] [address], [address] [place], [place] [address] [phone]

## 2022-08-08 ENCOUNTER — Ambulatory Visit: Payer: Self-pay

## 2022-08-08 ENCOUNTER — Encounter: Payer: Self-pay | Admitting: Urology

## 2022-08-08 DIAGNOSIS — E291 Testicular hypofunction: Secondary | ICD-10-CM

## 2022-08-08 NOTE — Progress Notes (Signed)
Pt received bi-weekly injection testosterone. Left del.

## 2022-09-06 ENCOUNTER — Ambulatory Visit: Payer: Self-pay

## 2022-09-06 DIAGNOSIS — E291 Testicular hypofunction: Secondary | ICD-10-CM

## 2022-09-06 NOTE — Progress Notes (Signed)
Pt presents today for bi-weekly testosterone injection.

## 2022-09-12 ENCOUNTER — Other Ambulatory Visit: Payer: Self-pay

## 2022-09-12 DIAGNOSIS — E1122 Type 2 diabetes mellitus with diabetic chronic kidney disease: Secondary | ICD-10-CM

## 2022-09-12 NOTE — Progress Notes (Signed)
Pt presents today to complete a1c labs.

## 2022-09-13 LAB — HGB A1C W/O EAG: Hgb A1c MFr Bld: 6.1 % — ABNORMAL HIGH (ref 4.8–5.6)

## 2022-09-20 ENCOUNTER — Ambulatory Visit: Payer: 59

## 2022-09-20 DIAGNOSIS — E291 Testicular hypofunction: Secondary | ICD-10-CM

## 2022-09-20 NOTE — Progress Notes (Signed)
Pt completed bi-weekly injection.

## 2022-10-04 ENCOUNTER — Ambulatory Visit: Payer: Self-pay

## 2022-10-04 DIAGNOSIS — Z83719 Family history of colon polyps, unspecified: Secondary | ICD-10-CM | POA: Diagnosis not present

## 2022-10-04 DIAGNOSIS — E291 Testicular hypofunction: Secondary | ICD-10-CM

## 2022-10-04 DIAGNOSIS — Z01818 Encounter for other preprocedural examination: Secondary | ICD-10-CM | POA: Diagnosis not present

## 2022-10-04 DIAGNOSIS — Z1211 Encounter for screening for malignant neoplasm of colon: Secondary | ICD-10-CM | POA: Diagnosis not present

## 2022-10-04 MED ORDER — TESTOSTERONE CYPIONATE 200 MG/ML IM SOLN
200.0000 mg | INTRAMUSCULAR | Status: AC
  Administered 2022-10-04 – 2023-03-07 (×12): 200 mg via INTRAMUSCULAR

## 2022-10-04 NOTE — Progress Notes (Signed)
Pt completed bi weekly injection. Rt Deltoid.

## 2022-10-11 ENCOUNTER — Other Ambulatory Visit: Payer: Self-pay

## 2022-10-11 DIAGNOSIS — I10 Essential (primary) hypertension: Secondary | ICD-10-CM

## 2022-10-17 ENCOUNTER — Other Ambulatory Visit: Payer: Self-pay

## 2022-10-17 DIAGNOSIS — N1831 Chronic kidney disease, stage 3a: Secondary | ICD-10-CM

## 2022-10-17 MED ORDER — VICTOZA 18 MG/3ML ~~LOC~~ SOPN: PEN_INJECTOR | SUBCUTANEOUS | 3 refills | Status: DC

## 2022-10-18 ENCOUNTER — Ambulatory Visit: Payer: Self-pay

## 2022-10-18 DIAGNOSIS — E291 Testicular hypofunction: Secondary | ICD-10-CM

## 2022-10-18 NOTE — Progress Notes (Signed)
Pt received bi-weekly injection, testosterone Lft deltoid. Pt tolerated well./CL,RMA

## 2022-11-01 ENCOUNTER — Ambulatory Visit: Payer: Self-pay

## 2022-11-01 ENCOUNTER — Other Ambulatory Visit: Payer: Self-pay | Admitting: Urology

## 2022-11-01 DIAGNOSIS — E291 Testicular hypofunction: Secondary | ICD-10-CM

## 2022-11-01 NOTE — Progress Notes (Signed)
Pt presents today for bi-weekly injection. Rt del, Pt tolerated well./Cl,RMA

## 2022-11-15 ENCOUNTER — Ambulatory Visit: Payer: Self-pay

## 2022-11-15 DIAGNOSIS — E291 Testicular hypofunction: Secondary | ICD-10-CM

## 2022-11-29 ENCOUNTER — Ambulatory Visit: Payer: Self-pay

## 2022-11-29 DIAGNOSIS — E291 Testicular hypofunction: Secondary | ICD-10-CM

## 2022-12-13 ENCOUNTER — Ambulatory Visit: Payer: Self-pay

## 2022-12-13 DIAGNOSIS — E291 Testicular hypofunction: Secondary | ICD-10-CM

## 2022-12-13 NOTE — Progress Notes (Signed)
Pt received bi weekly injection  

## 2022-12-26 ENCOUNTER — Ambulatory Visit: Payer: Self-pay

## 2022-12-26 DIAGNOSIS — E291 Testicular hypofunction: Secondary | ICD-10-CM

## 2022-12-27 ENCOUNTER — Other Ambulatory Visit: Payer: Self-pay | Admitting: Urology

## 2023-01-02 ENCOUNTER — Encounter: Payer: Self-pay | Admitting: Gastroenterology

## 2023-01-02 NOTE — H&P (Signed)
Pre-Procedure H&P   Patient ID: Ernest [name] is a 67 y.o. male.  Gastroenterology Provider: Jaynie Collins, DO  Referring Provider: Nona Dell, PA PCP: Wells Guiles  Date: 01/03/2023  HPI Ernest [name] is a 67 y.o. male who presents today for Colonoscopy for Colorectal cancer screening .  Patient last underwent colonoscopy in February 2014 which was unremarkable.  Ernest [name] was recommend to have a 5-year follow-up due to family history of polyps.  Per report his mother had polyps.  No family history of colon cancer  Patient is on Victoza which Ernest [name] has held for this procedure (12/28/22 last dose)  Patient reports bowel movement 2-3 times a day without melena or hematochezia.  Most recent lab work hemoglobin 15.4 MCV 82 platelets 241,000 A1c 6.1 creatinine 1.62   Past Medical History:  Diagnosis Date   CKD (chronic kidney disease)    Colon polyps    Diabetes mellitus without complication    Elevated lipids    Flat feet    Gout    Hyperkalemia    Hypertension    Over weight     Past Surgical History:  Procedure Laterality Date   COLONOSCOPY     HERNIA REPAIR      Family History Mother-colon polyps No other h/o GI disease or malignancy  Review of Systems  Constitutional:  Negative for activity change, appetite change, chills, diaphoresis, fatigue, fever and unexpected weight change.  HENT:  Negative for trouble swallowing and voice change.   Respiratory:  Negative for shortness of breath and wheezing.   Cardiovascular:  Negative for chest pain, palpitations and leg swelling.  Gastrointestinal:  Negative for abdominal distention, abdominal pain, anal bleeding, blood in stool, constipation, diarrhea, nausea and vomiting.  Musculoskeletal:  Negative for arthralgias and myalgias.  Skin:  Negative for color change and pallor.  Neurological:  Negative for dizziness, syncope and weakness.  Psychiatric/Behavioral:  Negative for confusion. The patient is not  nervous/anxious.   All other systems reviewed and are negative.    Medications No current facility-administered medications on file prior to encounter.   Current Outpatient Medications on File Prior to Encounter  Medication Sig Dispense Refill   aspirin EC 81 MG tablet Take by mouth.     atorvastatin (LIPITOR) 40 MG tablet TAKE 1 TABLET BY MOUTH ONCE DAILY. DISCONTINUE ATORVASTATIN 20MG .     chlorthalidone (HYGROTON) 25 MG tablet Take 1/2 tablet by mouth every morning 45 tablet 3   glipiZIDE (GLUCOTROL XL) 5 MG 24 hr tablet Take 1 tablet (5 mg total) by mouth daily. 90 tablet 3   lisinopril (ZESTRIL) 10 MG tablet Take 1 tablet (10 mg total) by mouth daily. 90 tablet 1   metFORMIN (GLUCOPHAGE) 1000 MG tablet Take 1 tablet (1,000 mg total) by mouth 2 (two) times daily with a meal. 180 tablet 3    Pertinent medications related to GI and procedure were reviewed by me with the patient prior to the procedure   Current Facility-Administered Medications:    0.9 %  sodium chloride infusion, , Intravenous, Continuous, Jaynie Collins, DO, Last Rate: 20 mL/hr at 01/03/23 1028, 20 mL/hr at 01/03/23 1028  sodium chloride 20 mL/hr (01/03/23 1028)       No Known Allergies Allergies were reviewed by me prior to the procedure  Objective   Body mass index is 31.88 kg/m. Vitals:   01/03/23 1019  BP: (!) 144/95  Pulse: (!) 110  Resp: 20  Temp: (!) 97.3  F (36.3 C)  TempSrc: Temporal  SpO2: 100%  Weight: 100.8 kg  Height: 5\' 10"  (1.778 m)     Physical Exam Vitals and nursing note reviewed.  Constitutional:      General: Ernest [name] is not in acute distress.    Appearance: Normal appearance. Ernest [name] is not ill-appearing, toxic-appearing or diaphoretic.  HENT:     Head: Normocephalic and atraumatic.     Nose: Nose normal.     Mouth/Throat:     Mouth: Mucous membranes are moist.     Pharynx: Oropharynx is clear.  Eyes:     General: No scleral icterus.    Extraocular Movements: Extraocular  movements intact.  Cardiovascular:     Rate and Rhythm: Regular rhythm. Tachycardia present.     Heart sounds: Normal heart sounds. No murmur heard.    No friction rub. No gallop.  Pulmonary:     Effort: Pulmonary effort is normal. No respiratory distress.     Breath sounds: Normal breath sounds. No wheezing, rhonchi or rales.  Abdominal:     General: Bowel sounds are normal. There is no distension.     Palpations: Abdomen is soft.     Tenderness: There is no abdominal tenderness. There is no guarding or rebound.  Musculoskeletal:     Cervical back: Neck supple.     Right lower leg: No edema.     Left lower leg: No edema.  Skin:    General: Skin is warm and dry.     Coloration: Skin is not jaundiced or pale.  Neurological:     General: No focal deficit present.     Mental Status: Ernest [name] is alert and oriented to person, place, and time. Mental status is at baseline.  Psychiatric:        Mood and Affect: Mood normal.        Behavior: Behavior normal.        Thought Content: Thought content normal.        Judgment: Judgment normal.      Assessment:  Ernest [name] is a 67 y.o. male  who presents today for Colonoscopy for Colorectal cancer screening .  Plan:  Colonoscopy with possible intervention today  Colonoscopy with possible biopsy, control of bleeding, polypectomy, and interventions as necessary has been discussed with the patient/patient representative. Informed consent was obtained from the patient/patient representative after explaining the indication, nature, and risks of the procedure including but not limited to death, bleeding, perforation, missed neoplasm/lesions, cardiorespiratory compromise, and reaction to medications. Opportunity for questions was given and appropriate answers were provided. Patient/patient representative has verbalized understanding is amenable to undergoing the procedure.   Jaynie Collins, DO  [facility]  Gastroenterology  Portions of the record may have been created with voice recognition software. Occasional wrong-word or 'sound-a-like' substitutions may have occurred due to the inherent limitations of voice recognition software.  Read the chart carefully and recognize, using context, where substitutions may have occurred.

## 2023-01-03 ENCOUNTER — Ambulatory Visit: Payer: 59 | Admitting: Anesthesiology

## 2023-01-03 ENCOUNTER — Ambulatory Visit
Admission: RE | Admit: 2023-01-03 | Discharge: 2023-01-03 | Disposition: A | Discharge status: Home / Self Care | Payer: 59 | Attending: Gastroenterology | Admitting: Gastroenterology

## 2023-01-03 ENCOUNTER — Encounter: Payer: Self-pay | Admitting: Gastroenterology

## 2023-01-03 ENCOUNTER — Encounter
Admission: RE | Disposition: A | Discharge status: Home / Self Care | Payer: Self-pay | Source: Home / Self Care | Attending: Gastroenterology

## 2023-01-03 DIAGNOSIS — D123 Benign neoplasm of transverse colon: Secondary | ICD-10-CM | POA: Diagnosis not present

## 2023-01-03 DIAGNOSIS — Z7984 Long term (current) use of oral hypoglycemic drugs: Secondary | ICD-10-CM | POA: Diagnosis not present

## 2023-01-03 DIAGNOSIS — Z1211 Encounter for screening for malignant neoplasm of colon: Secondary | ICD-10-CM | POA: Diagnosis not present

## 2023-01-03 DIAGNOSIS — E669 Obesity, unspecified: Secondary | ICD-10-CM | POA: Diagnosis not present

## 2023-01-03 DIAGNOSIS — K640 First degree hemorrhoids: Secondary | ICD-10-CM | POA: Diagnosis not present

## 2023-01-03 DIAGNOSIS — Z83719 Family history of colon polyps, unspecified: Secondary | ICD-10-CM | POA: Diagnosis not present

## 2023-01-03 DIAGNOSIS — D124 Benign neoplasm of descending colon: Secondary | ICD-10-CM | POA: Diagnosis not present

## 2023-01-03 DIAGNOSIS — D128 Benign neoplasm of rectum: Secondary | ICD-10-CM | POA: Diagnosis not present

## 2023-01-03 DIAGNOSIS — K5730 Diverticulosis of large intestine without perforation or abscess without bleeding: Secondary | ICD-10-CM | POA: Diagnosis not present

## 2023-01-03 DIAGNOSIS — D125 Benign neoplasm of sigmoid colon: Secondary | ICD-10-CM | POA: Insufficient documentation

## 2023-01-03 DIAGNOSIS — I129 Hypertensive chronic kidney disease with stage 1 through stage 4 chronic kidney disease, or unspecified chronic kidney disease: Secondary | ICD-10-CM | POA: Insufficient documentation

## 2023-01-03 DIAGNOSIS — E1122 Type 2 diabetes mellitus with diabetic chronic kidney disease: Secondary | ICD-10-CM | POA: Diagnosis not present

## 2023-01-03 DIAGNOSIS — Z800 Family history of malignant neoplasm of digestive organs: Secondary | ICD-10-CM | POA: Diagnosis not present

## 2023-01-03 DIAGNOSIS — Z6831 Body mass index (BMI) 31.0-31.9, adult: Secondary | ICD-10-CM | POA: Diagnosis not present

## 2023-01-03 DIAGNOSIS — D126 Benign neoplasm of colon, unspecified: Secondary | ICD-10-CM | POA: Diagnosis not present

## 2023-01-03 DIAGNOSIS — K635 Polyp of colon: Secondary | ICD-10-CM | POA: Diagnosis not present

## 2023-01-03 DIAGNOSIS — K649 Unspecified hemorrhoids: Secondary | ICD-10-CM | POA: Diagnosis not present

## 2023-01-03 DIAGNOSIS — G4730 Sleep apnea, unspecified: Secondary | ICD-10-CM | POA: Insufficient documentation

## 2023-01-03 DIAGNOSIS — D175 Benign lipomatous neoplasm of intra-abdominal organs: Secondary | ICD-10-CM | POA: Insufficient documentation

## 2023-01-03 DIAGNOSIS — N189 Chronic kidney disease, unspecified: Secondary | ICD-10-CM | POA: Diagnosis not present

## 2023-01-03 DIAGNOSIS — K621 Rectal polyp: Secondary | ICD-10-CM | POA: Diagnosis not present

## 2023-01-03 DIAGNOSIS — N1831 Chronic kidney disease, stage 3a: Secondary | ICD-10-CM | POA: Diagnosis not present

## 2023-01-03 HISTORY — PX: COLONOSCOPY WITH PROPOFOL: SHX5780

## 2023-01-03 LAB — GLUCOSE, CAPILLARY: Glucose-Capillary: 67 mg/dL — ABNORMAL LOW (ref 70–99)

## 2023-01-03 SURGERY — COLONOSCOPY WITH PROPOFOL
Anesthesia: General

## 2023-01-03 MED ORDER — STERILE WATER FOR IRRIGATION IR SOLN
Status: DC | PRN
  Administered 2023-01-03: 120 mL

## 2023-01-03 MED ORDER — PHENYLEPHRINE HCL (PRESSORS) 10 MG/ML IV SOLN
INTRAVENOUS | Status: DC | PRN
  Administered 2023-01-03: 160 ug via INTRAVENOUS
  Administered 2023-01-03: 80 ug via INTRAVENOUS
  Administered 2023-01-03 (×4): 160 ug via INTRAVENOUS

## 2023-01-03 MED ORDER — PROPOFOL 10 MG/ML IV BOLUS
INTRAVENOUS | Status: DC | PRN
  Administered 2023-01-03: 50 mg via INTRAVENOUS
  Administered 2023-01-03: 150 ug/kg/min via INTRAVENOUS

## 2023-01-03 MED ORDER — SODIUM CHLORIDE 0.9 % IV SOLN
INTRAVENOUS | Status: DC
  Administered 2023-01-03: 20 mL/h via INTRAVENOUS

## 2023-01-03 MED ORDER — DEXMEDETOMIDINE HCL IN NACL 80 MCG/20ML IV SOLN
INTRAVENOUS | Status: DC | PRN
  Administered 2023-01-03: 12 ug via BUCCAL

## 2023-01-03 MED ORDER — LIDOCAINE HCL (CARDIAC) PF 100 MG/5ML IV SOSY
PREFILLED_SYRINGE | INTRAVENOUS | Status: DC | PRN
  Administered 2023-01-03: 100 mg via INTRAVENOUS

## 2023-01-03 NOTE — Interval H&P Note (Signed)
History and Physical Interval Note: Preprocedure H&P from 01/03/23  was reviewed and there was no interval change after seeing and examining the patient.  Written consent was obtained from the patient after discussion of risks, benefits, and alternatives. Patient has consented to proceed with Colonoscopy with possible intervention   01/03/2023 10:36 AM  Ernest [name]  has presented today for surgery, with the diagnosis of family history colon polyps mother.  The various methods of treatment have been discussed with the patient and family. After consideration of risks, benefits and other options for treatment, the patient has consented to  Procedure(s): COLONOSCOPY WITH PROPOFOL (N/A) as a surgical intervention.  The patient's history has been reviewed, patient examined, no change in status, stable for surgery.  I have reviewed the patient's chart and labs.  Questions were answered to the patient's satisfaction.     Jaynie Collins

## 2023-01-03 NOTE — Anesthesia Preprocedure Evaluation (Signed)
Anesthesia Evaluation  Patient identified by MRN, date of birth, ID band Patient awake    Reviewed: Allergy & Precautions, NPO status , Patient's Chart, lab work & pertinent test results  Airway Mallampati: II  TM Distance: >3 FB Neck ROM: Full    Dental  (+) Teeth Intact   Pulmonary neg pulmonary ROS, sleep apnea    Pulmonary exam normal  + decreased breath sounds      Cardiovascular Exercise Tolerance: Good hypertension, Pt. on medications negative cardio ROS Normal cardiovascular exam Rhythm:Regular Rate:Normal     Neuro/Psych negative neurological ROS  negative psych ROS   GI/Hepatic negative GI ROS, Neg liver ROS,,,  Endo/Other  negative endocrine ROSdiabetes, Type 2, Oral Hypoglycemic Agents    Renal/GU negative Renal ROS  negative genitourinary   Musculoskeletal negative musculoskeletal ROS (+)    Abdominal  (+) + obese  Peds negative pediatric ROS (+)  Hematology negative hematology ROS (+)   Anesthesia Other Findings Past Medical History: No date: CKD (chronic kidney disease) No date: Colon polyps No date: Diabetes mellitus without complication No date: Elevated lipids No date: Flat feet No date: Gout No date: Hyperkalemia No date: Hypertension No date: Over weight  Past Surgical History: No date: COLONOSCOPY No date: HERNIA REPAIR  BMI    Body Mass Index: 31.88 kg/m      Reproductive/Obstetrics negative OB ROS                             Anesthesia Physical Anesthesia Plan  ASA: 3  Anesthesia Plan: General   Post-op Pain Management:    Induction: Intravenous  PONV Risk Score and Plan: Propofol infusion and TIVA  Airway Management Planned: Natural Airway  Additional Equipment:   Intra-op Plan:   Post-operative Plan:   Informed Consent: I have reviewed the patients History and Physical, chart, labs and discussed the procedure including the risks,  benefits and alternatives for the proposed anesthesia with the patient or authorized representative who has indicated his/her understanding and acceptance.     Dental Advisory Given  Plan Discussed with: CRNA and Surgeon  Anesthesia Plan Comments:        Anesthesia Quick Evaluation

## 2023-01-03 NOTE — Anesthesia Postprocedure Evaluation (Signed)
Anesthesia Post Note  Patient: Ernest [name]  Procedure(s) Performed: COLONOSCOPY WITH PROPOFOL  Patient location during evaluation: PACU Anesthesia Type: General Level of consciousness: awake and oriented Pain management: pain level controlled Vital Signs Assessment: post-procedure vital signs reviewed and stable Respiratory status: spontaneous breathing and respiratory function stable Cardiovascular status: blood pressure returned to baseline Anesthetic complications: no   There were no known notable events for this encounter.   Last Vitals:  Vitals:   01/03/23 1118 01/03/23 1128  BP: 102/74 110/74  Pulse: 92 89  Resp: 16   Temp: (!) 35.9 C   SpO2: 100% 100%    Last Pain:  Vitals:   01/03/23 1118  TempSrc: Temporal  PainSc: 0-No pain                 VAN STAVEREN,Laretha Luepke

## 2023-01-03 NOTE — Transfer of Care (Signed)
Immediate Anesthesia Transfer of Care Note  Patient: Ernest [name]  Procedure(s) Performed: COLONOSCOPY WITH PROPOFOL  Patient Location: PACU  Anesthesia Type:General  Level of Consciousness: awake, alert , and oriented  Airway & Oxygen Therapy: Patient Spontanous Breathing  Post-op Assessment: Report given to RN and Post -op Vital signs reviewed and stable  Post vital signs: stable  Last Vitals:  Vitals Value Taken Time  BP    Temp    Pulse    Resp    SpO2      Last Pain:  Vitals:   01/03/23 1019  TempSrc: Temporal  PainSc: 0-No pain         Complications: No notable events documented.

## 2023-01-03 NOTE — Op Note (Addendum)
[name] Patient Name: Ernest [name] Procedure Date: 01/03/2023 10:31 AM MRN: [id number] Account #: [id number] Date of Birth: [birth year] Admit Type: Outpatient Age: 67 Room: [facility] ENDO ROOM 1 Gender: Male Note Status: Supervisor Override Instrument Name: Nelda Marseille [id number] Procedure:             Colonoscopy Indications:           Family history of colon polyps Providers:             Trenda Moots, DO Referring MD:          Joni Reining, MD (Referring MD) Medicines:             Monitored Anesthesia Care Complications:         No immediate complications. Estimated blood loss:                         Minimal. Procedure:             Pre-Anesthesia Assessment:                        - Prior to the procedure, a History and Physical was                         performed, and patient medications and allergies were                         reviewed. The patient is competent. The risks and                         benefits of the procedure and the sedation options and                         risks were discussed with the patient. All questions                         were answered and informed consent was obtained.                         Patient identification and proposed procedure were                         verified by the physician, the nurse, the anesthetist                         and the technician in the endoscopy suite. Mental                         Status Examination: alert and oriented. Airway                         Examination: normal oropharyngeal airway and neck                         mobility. Respiratory Examination: clear to                         auscultation. CV Examination: RRR, no murmurs, no S3  or S4. Prophylactic Antibiotics: The patient does not                         require prophylactic antibiotics. Prior                         Anticoagulants: The patient has taken no anticoagulant                          or antiplatelet agents. ASA Grade Assessment: III - A                         patient with severe systemic disease. After reviewing                         the risks and benefits, the patient was deemed in                         satisfactory condition to undergo the procedure. The                         anesthesia plan was to use monitored anesthesia care                         (MAC). Immediately prior to administration of                         medications, the patient was re-assessed for adequacy                         to receive sedatives. The heart rate, respiratory                         rate, oxygen saturations, blood pressure, adequacy of                         pulmonary ventilation, and response to care were                         monitored throughout the procedure. The physical                         status of the patient was re-assessed after the                         procedure.                        After obtaining informed consent, the colonoscope was                         passed under direct vision. Throughout the procedure,                         the patient's blood pressure, pulse, and oxygen                         saturations were monitored continuously. The  Colonoscope was introduced through the anus and                         advanced to the the terminal ileum, with                         identification of the appendiceal orifice and IC                         valve. The colonoscopy was performed without                         difficulty. The patient tolerated the procedure well.                         The quality of the bowel preparation was evaluated                         using the BBPS [facility]([place] Bowel Preparation Scale) with                         scores of: Right Colon = 2 (minor amount of residual                         staining, small fragments of stool and/or opaque                         liquid, but  mucosa seen well), Transverse Colon = 3                         (entire mucosa seen well with no residual staining,                         small fragments of stool or opaque liquid) and Left                         Colon = 3 (entire mucosa seen well with no residual                         staining, small fragments of stool or opaque liquid).                         The total BBPS score equals 8. The quality of the                         bowel preparation was excellent. The terminal ileum,                         ileocecal valve, appendiceal orifice, and rectum were                         photographed. Findings:      The perianal and digital rectal examinations were normal. Pertinent       negatives include normal sphincter tone.      The terminal ileum appeared normal. Estimated blood loss: none.      Multiple small-mouthed diverticula were found in the entire colon. Most  prevalent within the left colon Estimated blood loss: none.      Non-bleeding internal hemorrhoids were found during retroflexion. The       hemorrhoids were Grade I (internal hemorrhoids that do not prolapse).       Estimated blood loss: none.      There was a large lipoma, in the ascending colon. Biopsies were taken       with a cold forceps for histology. Double bite. Estimated blood loss was       minimal.      Two sessile polyps were found in the descending colon and transverse       colon. The polyps were 3 to 6 mm in size. These polyps were removed with       a cold snare. Resection and retrieval were complete. Estimated blood       loss was minimal.      Five sessile polyps were found in the rectum (2), sigmoid colon (2) and       transverse colon (1). The polyps were 1 to 2 mm in size. These polyps       were removed with a jumbo cold forceps. Resection and retrieval were       complete. Estimated blood loss was minimal.      The exam was otherwise without abnormality on direct and retroflexion        views. Impression:            - The examined portion of the ileum was normal.                        - Diverticulosis in the entire examined colon.                        - Non-bleeding internal hemorrhoids.                        - Large lipoma in the ascending colon. Biopsied.                        - Two 3 to 6 mm polyps in the descending colon and in                         the transverse colon, removed with a cold snare.                         Resected and retrieved.                        - Five 1 to 2 mm polyps in the rectum, in the sigmoid                         colon and in the transverse colon, removed with a                         jumbo cold forceps. Resected and retrieved.                        - The examination was otherwise normal on direct and  retroflexion views. Recommendation:        - Patient has a contact number available for                         emergencies. The signs and symptoms of potential                         delayed complications were discussed with the patient.                         Return to normal activities tomorrow. Written                         discharge instructions were provided to the patient.                        - Discharge patient to home.                        - Resume previous diet.                        - Continue present medications.                        - No ibuprofen, naproxen, or other non-steroidal                         anti-inflammatory drugs for 5 days after polyp removal.                        - Await pathology results.                        - Repeat colonoscopy for surveillance based on                         pathology results.                        - Return to referring physician as previously                         scheduled.                        - The findings and recommendations were discussed with                         the patient. Procedure Code(s):     --- Professional ---                         [phone], Colonoscopy, flexible; with removal of                         tumor(s), polyp(s), or other lesion(s) by snare                         technique                        45380, 59,  Colonoscopy, flexible; with biopsy, single                         or multiple Diagnosis Code(s):     --- Professional ---                        K64.0, First degree hemorrhoids                        D17.5, Benign lipomatous neoplasm of intra-abdominal                         organs                        D12.4, Benign neoplasm of descending colon                        D12.8, Benign neoplasm of rectum                        D12.5, Benign neoplasm of sigmoid colon                        D12.3, Benign neoplasm of transverse colon (hepatic                         flexure or splenic flexure)                        K57.30, Diverticulosis of large intestine without                         perforation or abscess without bleeding CPT copyright 2022 American [employer]. All rights reserved. The codes documented in this report are preliminary and upon coder review may  be revised to meet current compliance requirements. Attending Participation:      I personally performed the entire procedure. Elfredia Nevins, DO Jaynie Collins DO, DO 01/03/2023 11:21:45 AM This report has been signed electronically. Number of Addenda: 0 Note Initiated On: 01/03/2023 10:31 AM Scope Withdrawal Time: 0 hours 18 minutes 31 seconds  Total Procedure Duration: 0 hours 26 minutes 34 seconds  Estimated Blood Loss:  Estimated blood loss was minimal.      [facility]

## 2023-01-06 ENCOUNTER — Encounter: Payer: Self-pay | Admitting: Gastroenterology

## 2023-01-06 LAB — SURGICAL PATHOLOGY

## 2023-01-10 ENCOUNTER — Ambulatory Visit: Payer: Self-pay

## 2023-01-10 DIAGNOSIS — E291 Testicular hypofunction: Secondary | ICD-10-CM

## 2023-01-24 ENCOUNTER — Ambulatory Visit: Payer: Self-pay

## 2023-01-24 DIAGNOSIS — E291 Testicular hypofunction: Secondary | ICD-10-CM

## 2023-02-04 ENCOUNTER — Other Ambulatory Visit: Payer: Self-pay

## 2023-02-04 DIAGNOSIS — I10 Essential (primary) hypertension: Secondary | ICD-10-CM

## 2023-02-04 MED ORDER — CHLORTHALIDONE 25 MG PO TABS
ORAL_TABLET | ORAL | 3 refills | Status: DC
Start: 2023-02-04 — End: 2023-08-11

## 2023-02-04 MED ORDER — LISINOPRIL 10 MG PO TABS: 10.0000 mg | ORAL_TABLET | Freq: Every day | ORAL | 3 refills | Status: DC

## 2023-02-06 ENCOUNTER — Other Ambulatory Visit: Payer: 59

## 2023-02-06 DIAGNOSIS — Z87898 Personal history of other specified conditions: Secondary | ICD-10-CM

## 2023-02-07 ENCOUNTER — Ambulatory Visit: Payer: Self-pay

## 2023-02-07 DIAGNOSIS — E291 Testicular hypofunction: Secondary | ICD-10-CM

## 2023-02-07 LAB — HEMATOCRIT: Hematocrit: 46.4 % (ref 37.5–51.0)

## 2023-02-07 LAB — TESTOSTERONE: Testosterone: 389 ng/dL (ref 264–916)

## 2023-02-07 NOTE — Progress Notes (Signed)
Testosterone injection given as ordered and tolerated well.

## 2023-02-18 ENCOUNTER — Other Ambulatory Visit: Payer: Self-pay | Admitting: Urology

## 2023-02-21 ENCOUNTER — Ambulatory Visit: Payer: Self-pay

## 2023-02-21 DIAGNOSIS — Z0000 Encounter for general adult medical examination without abnormal findings: Secondary | ICD-10-CM

## 2023-02-21 LAB — POCT URINALYSIS DIPSTICK

## 2023-02-21 NOTE — Progress Notes (Signed)
Pt completed labs for physical. ?

## 2023-02-21 NOTE — Addendum Note (Signed)
Addended by: Christianne Dolin F on: 02/21/2023 11:00 AM   Modules accepted: Orders

## 2023-02-22 ENCOUNTER — Other Ambulatory Visit: Payer: Self-pay | Admitting: Physician Assistant

## 2023-02-22 DIAGNOSIS — N1831 Chronic kidney disease, stage 3a: Secondary | ICD-10-CM

## 2023-02-22 LAB — CMP12+LP+TP+TSH+6AC+PSA+CBC…
ALT: 16 IU/L (ref 0–44)
AST: 21 IU/L (ref 0–40)
Albumin/Globulin Ratio: 1.1 — ABNORMAL LOW (ref 1.2–2.2)
Albumin: 3.9 g/dL (ref 3.9–4.9)
Alkaline Phosphatase: 112 IU/L (ref 44–121)
BUN/Creatinine Ratio: 13 (ref 10–24)
BUN: 25 mg/dL (ref 8–27)
Basophils Absolute: 0 10*3/uL (ref 0.0–0.2)
Basos: 0 %
Bilirubin Total: 0.6 mg/dL (ref 0.0–1.2)
Calcium: 9.5 mg/dL (ref 8.6–10.2)
Chloride: 97 mmol/L (ref 96–106)
Chol/HDL Ratio: 4.5 ratio (ref 0.0–5.0)
Cholesterol, Total: 180 mg/dL (ref 100–199)
Creatinine, Ser: 1.88 mg/dL — ABNORMAL HIGH (ref 0.76–1.27)
EOS (ABSOLUTE): 0.1 10*3/uL (ref 0.0–0.4)
Eos: 2 %
Estimated CHD Risk: 0.9 times avg. (ref 0.0–1.0)
Free Thyroxine Index: 2.6 (ref 1.2–4.9)
GGT: 112 IU/L — ABNORMAL HIGH (ref 0–65)
Globulin, Total: 3.4 g/dL (ref 1.5–4.5)
Glucose: 82 mg/dL (ref 70–99)
HDL: 40 mg/dL (ref >39)
Hematocrit: 43.4 % (ref 37.5–51.0)
Hemoglobin: 14.4 g/dL (ref 13.0–17.7)
Immature Grans (Abs): 0 10*3/uL (ref 0.0–0.1)
Immature Granulocytes: 1 %
Iron: 38 ug/dL (ref 38–169)
LDH: 228 IU/L — ABNORMAL HIGH (ref 121–224)
LDL Chol Calc (NIH): 125 mg/dL — ABNORMAL HIGH (ref 0–99)
Lymphocytes Absolute: 0.5 10*3/uL — ABNORMAL LOW (ref 0.7–3.1)
Lymphs: 8 %
MCH: 25.8 pg — ABNORMAL LOW (ref 26.6–33.0)
MCHC: 33.2 g/dL (ref 31.5–35.7)
MCV: 78 fL — ABNORMAL LOW (ref 79–97)
Monocytes Absolute: 1.1 10*3/uL — ABNORMAL HIGH (ref 0.1–0.9)
Monocytes: 18 %
Neutrophils Absolute: 4.5 10*3/uL (ref 1.4–7.0)
Neutrophils: 71 %
Phosphorus: 3.8 mg/dL (ref 2.8–4.1)
Platelets: 241 10*3/uL (ref 150–450)
Potassium: 4.7 mmol/L (ref 3.5–5.2)
Prostate Specific Ag, Serum: 2.6 ng/mL (ref 0.0–4.0)
RBC: 5.58 x10E6/uL (ref 4.14–5.80)
RDW: 15.9 % — ABNORMAL HIGH (ref 11.6–15.4)
Sodium: 136 mmol/L (ref 134–144)
T3 Uptake Ratio: 35 % (ref 24–39)
T4, Total: 7.3 ug/dL (ref 4.5–12.0)
TSH: 1.34 u[IU]/mL (ref 0.450–4.500)
Total Protein: 7.3 g/dL (ref 6.0–8.5)
Triglycerides: 79 mg/dL (ref 0–149)
Uric Acid: 9.6 mg/dL — ABNORMAL HIGH (ref 3.8–8.4)
VLDL Cholesterol Cal: 15 mg/dL (ref 5–40)
WBC: 6.3 10*3/uL (ref 3.4–10.8)
eGFR: 39 mL/min/{1.73_m2} — ABNORMAL LOW (ref >59)

## 2023-02-22 LAB — HGB A1C W/O EAG: Hgb A1c MFr Bld: 6.3 % — ABNORMAL HIGH (ref 4.8–5.6)

## 2023-02-22 LAB — MICROALBUMIN / CREATININE URINE RATIO

## 2023-02-25 ENCOUNTER — Encounter: Payer: Self-pay | Admitting: Physician Assistant

## 2023-02-25 ENCOUNTER — Ambulatory Visit: Payer: Self-pay | Admitting: Physician Assistant

## 2023-02-25 ENCOUNTER — Other Ambulatory Visit: Payer: Self-pay | Admitting: Physician Assistant

## 2023-02-25 ENCOUNTER — Other Ambulatory Visit: Payer: Self-pay | Admitting: Urology

## 2023-02-25 VITALS — BP 102/74 | HR 100 | Temp 97.1°F | Resp 12 | Ht 70.0 in | Wt 224.0 lb

## 2023-02-25 DIAGNOSIS — E1122 Type 2 diabetes mellitus with diabetic chronic kidney disease: Secondary | ICD-10-CM

## 2023-02-25 DIAGNOSIS — Z0000 Encounter for general adult medical examination without abnormal findings: Secondary | ICD-10-CM

## 2023-02-25 DIAGNOSIS — E291 Testicular hypofunction: Secondary | ICD-10-CM

## 2023-02-25 LAB — POCT URINALYSIS DIPSTICK
Bilirubin, UA: NEGATIVE
Blood, UA: NEGATIVE
Glucose, UA: NEGATIVE
Ketones, UA: NEGATIVE
Leukocytes, UA: NEGATIVE
Nitrite, UA: NEGATIVE
Protein, UA: NEGATIVE
Spec Grav, UA: >=1.03 — AB (ref 1.010–1.025)
Urobilinogen, UA: 0.2 E.U./dL
pH, UA: 5.5 (ref 5.0–8.0)

## 2023-02-25 MED ORDER — VICTOZA 18 MG/3ML ~~LOC~~ SOPN
PEN_INJECTOR | SUBCUTANEOUS | 3 refills | Status: AC
Start: 2023-02-25 — End: ?

## 2023-02-25 MED ORDER — TESTOSTERONE CYPIONATE 200 MG/ML IM SOLN
INTRAMUSCULAR | 0 refills | Status: DC
Start: 2023-02-25 — End: 2023-08-12

## 2023-02-25 NOTE — Telephone Encounter (Signed)
From: Ernest Mallick To: Office of Maria Stein, [place] Sent: 02/22/2023 3:21 PM EDT Subject: Medication Renewal Request  Refills have been requested for the following medications:   testosterone cypionate (DEPOTESTOSTERONE CYPIONATE) 200 MG/ML injection [Jashay Roddy]  Preferred pharmacy: [facility] - [place] [address] [place] - [address] Delivery method: [employer]

## 2023-02-25 NOTE — Progress Notes (Signed)
Stopped taking Metformin about 4 months ago - stated he was feeling pretty bad & diffculty holding urine.  States after he stopped taking it , he started feeling better.

## 2023-02-25 NOTE — Progress Notes (Signed)
[facility]  ____________________________________________   None    (approximate)  I have reviewed the triage vital signs and the nursing notes.   HISTORY  Chief Complaint Annual Exam   HPI Ernest [name] is a 67 y.o. male patient presents for annual physical exam.  Voices no complaints or concerns.  Patient states 4 months ago he discontinued taking metformin due to a side effect affect increase nocturnal frequency.  States no increased blood glucose.  Patient continues to take Glucotrol and Victoza.  Requests refill of Victoza.  Hemoglobin A1c 5 months ago was 6.1 and hemoglobin A1c 4 days ago was 6.3.         Past Medical History:  Diagnosis Date   CKD (chronic kidney disease)    Colon polyps    Diabetes mellitus without complication (HCC)    Elevated lipids    Flat feet    Gout    Hyperkalemia    Hypertension    Over weight     Patient Active Problem List   Diagnosis Date Noted   OSA on CPAP 08/06/2021   CPAP use counseling 08/06/2021   OSA (obstructive sleep apnea) 04/09/2021   Other hypersomnia 04/09/2021   Essential hypertension 06/08/2019   Type 2 diabetes mellitus with stage 3 chronic kidney disease, without long-term current use of insulin (HCC) 06/08/2019   Chronic kidney disease (CKD) stage G3a/A2, moderately decreased glomerular filtration rate (GFR) between 45-59 mL/min/1.73 square meter and albuminuria creatinine ratio between 30-299 mg/g 06/08/2019   Class 2 severe obesity due to excess calories with serious comorbidity and body mass index (BMI) of 38.0 to 38.9 in adult [facility]) 06/08/2019   Hypogonadism in male 06/08/2019   At risk for obstructive sleep apnea 06/08/2019   Other hyperlipidemia 06/08/2019   Persistent proteinuria 03/24/2018    Past Surgical History:  Procedure Laterality Date   COLONOSCOPY     COLONOSCOPY WITH PROPOFOL N/A 01/03/2023   Procedure: COLONOSCOPY WITH PROPOFOL;  Surgeon: Jaynie Collins, DO;  Location: [facility] ENDOSCOPY;  Service: Gastroenterology;  Laterality: N/A;   HERNIA REPAIR      Prior to Admission medications   Medication Sig Start Date End Date Taking? Authorizing Provider  aspirin EC 81 MG tablet Take by mouth.    [provider]  atorvastatin (LIPITOR) 40 MG tablet TAKE 1 TABLET BY MOUTH ONCE DAILY. DISCONTINUE ATORVASTATIN 20MG . 01/25/19   [provider]  chlorthalidone (HYGROTON) 25 MG tablet Take 1/2 tablet by mouth every morning 02/04/23   Joni Reining, PA-C  glipiZIDE (GLUCOTROL XL) 5 MG 24 hr tablet Take 1 tablet (5 mg total) by mouth daily. 04/29/22   Joni Reining, PA-C  liraglutide (VICTOZA) 18 MG/3ML SOPN 1.2 mg SQ daily 10/17/22   Joni Reining, PA-C  lisinopril (ZESTRIL) 10 MG tablet Take 1 tablet (10 mg total) by mouth daily. 02/04/23   Joni Reining, PA-C  metFORMIN (GLUCOPHAGE) 1000 MG tablet Take 1 tablet (1,000 mg total) by mouth 2 (two) times daily with a meal. 02/12/22   Joni Reining, PA-C  testosterone cypionate (DEPOTESTOSTERONE CYPIONATE) 200 MG/ML injection INJECT 1 ML (CC) INTRAMUSCULARLY EVERY TWO WEEKS 02/25/23   Michiel Cowboy A, PA-C    Allergies Patient has no known allergies.  Family History  Problem Relation Age of Onset   Diabetes Mother    Diabetes Paternal Grandmother    Hypertension Paternal Grandmother    Diabetes Brother    Blindness Brother  due to DM    Social History Social History   Tobacco Use   Smoking status: Never   Smokeless tobacco: Never  Vaping Use   Vaping Use: Never used    Review of Systems Constitutional: No fever/chills Eyes: No visual changes. ENT: No sore throat. Cardiovascular: Denies chest pain. Respiratory: Denies shortness of breath. Gastrointestinal: No abdominal pain.  No nausea, no vomiting.  No diarrhea.  No constipation. Genitourinary: Negative for dysuria. Musculoskeletal: Negative for back pain.  Pes planus. Skin: Negative for  rash. Neurological: Negative for headaches, focal weakness or numbness. Endocrine: Chronic kidney disease, diabetes, hyperlipidemia, hypokalemia, and hypertension.  ____________________________________________   PHYSICAL EXAM: VITAL SIGNS: BP 102/74  BP Location Left Arm  Patient Position Sitting  Cuff Size Large  Pulse 100  Resp 12  Temp 97.1 F (36.2 C)  Temp src Temporal  SpO2 98 %  Weight 224 lb (101.6 kg)  Height 5\' 10"  (1.778 m)   BMI 32.14 kg/m2  BSA 2.24 m2     Constitutional: Alert and oriented. Well appearing and in no acute distress. Eyes: Conjunctivae are normal. PERRL. EOMI. Head: Atraumatic. Nose: No congestion/rhinnorhea. Mouth/Throat: Mucous membranes are moist.  Oropharynx non-erythematous. Neck: No stridor.  No cervical spine tenderness to palpation. Hematological/Lymphatic/Immunilogical: No cervical lymphadenopathy. Cardiovascular: Normal rate, regular rhythm. Grossly normal heart sounds.  Good peripheral circulation. Respiratory: Normal respiratory effort.  No retractions. Lungs CTAB. Gastrointestinal: Soft and nontender. No distention. No abdominal bruits. No CVA tenderness. Genitourinary: Deferred Musculoskeletal: No lower extremity tenderness nor edema.  No joint effusions. Neurologic:  Normal speech and language. No gross focal neurologic deficits are appreciated. No gait instability.  Bilateral foot neurological check intact. Skin:  Skin is warm, dry and intact. No rash noted.  Bilateral hypertrophic nails Psychiatric: Mood and affect are normal. Speech and behavior are normal.  ____________________________________________   LABS _          Component Ref Range & Units 4 d ago (02/21/23) 5 mo ago (09/12/22) 11 mo ago (03/13/22) 1 yr ago (07/12/21) 1 yr ago (03/14/21) 2 yr ago (11/08/20) 3 yr ago (02/18/20)  Hgb A1c MFr Bld 4.8 - 5.6 % 6.3 High  6.1 High  CM 5.9 High  CM 6.3 High  CM 7.8 High  CM 7.8 High  CM 6.7 High  CM  Comment:           Prediabetes: 5.7 - 6.4          Diabetes: >6.4          Glycemic control for adults with diabetes: <7.0  Resulting Agency [facility]                                   Component Ref Range & Units 4 d ago (02/21/23) 2 wk ago (02/06/23) 6 mo ago (08/01/22) 6 mo ago (08/01/22) 9 mo ago (05/02/22) 11 mo ago (03/13/22) 1 yr ago (01/29/22) 1 yr ago (01/29/22)  Glucose 70 - 99 mg/dL 82     74    Uric Acid 3.8 - 8.4 mg/dL 9.6 High      7.2 CM    Comment:            Therapeutic target for gout patients: <6.0  BUN 8 - 27 mg/dL 25     21    Creatinine, Ser 0.76 - 1.27 mg/dL 1.61 High  1.62 High     eGFR >59 mL/min/1.73 39 Low      47 Low     BUN/Creatinine Ratio 10 - 24 13     13     Sodium 134 - 144 mmol/L 136     138    Potassium 3.5 - 5.2 mmol/L 4.7     5.1    Chloride 96 - 106 mmol/L 97     100    Calcium 8.6 - 10.2 mg/dL 9.5     9.3    Phosphorus 2.8 - 4.1 mg/dL 3.8     3.9    Total Protein 6.0 - 8.5 g/dL 7.3     6.9    Albumin 3.9 - 4.9 g/dL 3.9     4.0 R    Globulin, Total 1.5 - 4.5 g/dL 3.4     2.9    Albumin/Globulin Ratio 1.2 - 2.2 1.1 Low      1.4    Bilirubin Total 0.0 - 1.2 mg/dL 0.6     0.5    Alkaline Phosphatase 44 - 121 IU/L 112     64    LDH 121 - 224 IU/L 228 High      157    AST 0 - 40 IU/L 21     17    ALT 0 - 44 IU/L 16     14    GGT 0 - 65 IU/L 112 High      40    Iron 38 - 169 ug/dL 38     51    Cholesterol, Total 100 - 199 mg/dL 161     096 High     Triglycerides 0 - 149 mg/dL 79     85    HDL >04 mg/dL 40     41    VLDL Cholesterol Cal 5 - 40 mg/dL 15     15    LDL Chol Calc (NIH) 0 - 99 mg/dL 540 High      981 High     Chol/HDL Ratio 0.0 - 5.0 ratio 4.5     5.0 CM    Comment:                                   T. Chol/HDL Ratio                                             Men  Women                               1/2 Avg.Risk  3.4    3.3                                   Avg.Risk   5.0    4.4                                2X Avg.Risk  9.6    7.1  3X Avg.Risk 23.4   11.0  Estimated CHD Risk 0.0 - 1.0 times avg. 0.9     1.0 CM    Comment: The CHD Risk is based on the T. Chol/HDL ratio. Other factors affect CHD Risk such as hypertension, smoking, diabetes, severe obesity, and family history of premature CHD.  TSH 0.450 - 4.500 uIU/mL 1.340     1.520    T4, Total 4.5 - 12.0 ug/dL 7.3     7.1    T3 Uptake Ratio 24 - 39 % 35     31    Free Thyroxine Index 1.2 - 4.9 2.6     2.2    Prostate Specific Ag, Serum 0.0 - 4.0 ng/mL 2.6  1.8 CM  3.1 CM 3.3 CM 3.9 CM   Comment: Roche ECLIA methodology. According to the American Urological Association, Serum PSA should decrease and remain at undetectable levels after radical prostatectomy. The AUA defines biochemical recurrence as an initial PSA value 0.2 ng/mL or greater followed by a subsequent confirmatory PSA value 0.2 ng/mL or greater. Values obtained with different assay methods or kits cannot be used interchangeably. Results cannot be interpreted as absolute evidence of the presence or absence of malignant disease.  WBC 3.4 - 10.8 x10E3/uL 6.3     7.8    RBC 4.14 - 5.80 x10E6/uL 5.58     5.82 High     Hemoglobin 13.0 - 17.7 g/dL 29.5     62.1    Hematocrit 37.5 - 51.0 % 43.4 46.4  46.4  47.5  45.7  MCV 79 - 97 fL 78 Low      82    MCH 26.6 - 33.0 pg 25.8 Low      26.5 Low     MCHC 31.5 - 35.7 g/dL 30.8     65.7    RDW 84.6 - 15.4 % 15.9 High      15.1    Platelets 150 - 450 x10E3/uL 241     241    Neutrophils Not Estab. % 71     75    Lymphs Not Estab. % 8     12    Monocytes Not Estab. % 18     12    Eos Not Estab. % 2     1    Basos Not Estab. % 0     0    Neutrophils Absolute 1.4 - 7.0 x10E3/uL 4.5     5.8    Lymphocytes Absolute 0.7 - 3.1 x10E3/uL 0.5 Low      0.9    Monocytes Absolute 0.1 - 0.9 x10E3/uL 1.1 High      1.0 High     EOS (ABSOLUTE) 0.0 - 0.4  x10E3/uL 0.1     0.1    Basophils Absolute 0.0 - 0.2 x10E3/uL 0.0     0.0    Immature Granulocytes Not Estab. % 1     0    Immature Grans (Abs)            __________________________________________  EKG  First-degree AV block at 97 bpm ____________________________________________    ____________________________________________   INITIAL IMPRESSION / ASSESSMENT AND PLAN  As part of my medical decision making, I reviewed the following data within the electronic MEDICAL RECORD NUMBER      No acute findings on physical exam.        ____________________________________________   FINAL CLINICAL IMPRESSION Well exam   ED Discharge Orders  Ordered    POCT urinalysis dipstick        02/25/23 0951    EKG 12-Lead        02/25/23 1610             Note:  This document was prepared using Dragon voice recognition software and may include unintentional dictation errors.

## 2023-03-07 ENCOUNTER — Ambulatory Visit: Payer: 59

## 2023-03-07 DIAGNOSIS — Z0000 Encounter for general adult medical examination without abnormal findings: Secondary | ICD-10-CM

## 2023-03-07 DIAGNOSIS — E291 Testicular hypofunction: Secondary | ICD-10-CM

## 2023-03-07 NOTE — Addendum Note (Signed)
Addended by: Gardner Candle on: 03/07/2023 01:43 PM   Modules accepted: Orders

## 2023-03-07 NOTE — Addendum Note (Signed)
Addended by: Gardner Candle on: 03/07/2023 03:05 PM   Modules accepted: Orders

## 2023-03-07 NOTE — Progress Notes (Signed)
Pt received bi weekly injection of testosterone Rt Deltoid.

## 2023-04-04 ENCOUNTER — Ambulatory Visit: Payer: 59

## 2023-04-04 DIAGNOSIS — E291 Testicular hypofunction: Secondary | ICD-10-CM

## 2023-04-04 MED ORDER — TESTOSTERONE CYPIONATE 200 MG/ML IM SOLN
100.0000 mg | Freq: Once | INTRAMUSCULAR | Status: AC
Start: 2023-04-04 — End: 2023-04-04
  Administered 2023-04-04: 100 mg via INTRAMUSCULAR

## 2023-04-04 NOTE — Progress Notes (Signed)
Pt completed bi weekly injection. Right arm.

## 2023-04-22 DIAGNOSIS — R197 Diarrhea, unspecified: Secondary | ICD-10-CM | POA: Diagnosis not present

## 2023-05-01 ENCOUNTER — Other Ambulatory Visit: Payer: Self-pay | Admitting: Physician Assistant

## 2023-05-01 DIAGNOSIS — N1831 Type 2 diabetes mellitus with diabetic chronic kidney disease: Secondary | ICD-10-CM

## 2023-05-08 ENCOUNTER — Other Ambulatory Visit: Payer: Self-pay

## 2023-05-08 DIAGNOSIS — E1122 Type 2 diabetes mellitus with diabetic chronic kidney disease: Secondary | ICD-10-CM

## 2023-05-08 DIAGNOSIS — I10 Essential (primary) hypertension: Secondary | ICD-10-CM

## 2023-05-08 MED ORDER — GLIPIZIDE ER 5 MG PO TB24
5.0000 mg | ORAL_TABLET | Freq: Every day | ORAL | 3 refills | Status: AC
Start: 2023-05-08 — End: ?

## 2023-05-28 ENCOUNTER — Other Ambulatory Visit: Payer: Self-pay

## 2023-05-28 DIAGNOSIS — N1831 Chronic kidney disease, stage 3a: Secondary | ICD-10-CM

## 2023-05-29 LAB — HGB A1C W/O EAG: Hgb A1c MFr Bld: 6.7 % — ABNORMAL HIGH (ref 4.8–5.6)

## 2023-06-19 ENCOUNTER — Other Ambulatory Visit: Payer: Self-pay | Admitting: Physician Assistant

## 2023-06-19 DIAGNOSIS — E1122 Type 2 diabetes mellitus with diabetic chronic kidney disease: Secondary | ICD-10-CM

## 2023-06-27 ENCOUNTER — Other Ambulatory Visit: Payer: Self-pay

## 2023-06-27 DIAGNOSIS — E1122 Type 2 diabetes mellitus with diabetic chronic kidney disease: Secondary | ICD-10-CM

## 2023-06-27 MED ORDER — LIRAGLUTIDE 18 MG/3ML ~~LOC~~ SOPN
PEN_INJECTOR | SUBCUTANEOUS | 3 refills | Status: DC
Start: 2023-06-27 — End: 2023-06-30

## 2023-06-30 ENCOUNTER — Other Ambulatory Visit: Payer: Self-pay

## 2023-06-30 DIAGNOSIS — N1831 Chronic kidney disease, stage 3a: Secondary | ICD-10-CM

## 2023-06-30 MED ORDER — LIRAGLUTIDE 18 MG/3ML ~~LOC~~ SOPN
PEN_INJECTOR | SUBCUTANEOUS | 3 refills | Status: DC
Start: 2023-06-30 — End: 2023-07-01

## 2023-07-01 ENCOUNTER — Other Ambulatory Visit: Payer: Self-pay

## 2023-07-01 DIAGNOSIS — E1122 Type 2 diabetes mellitus with diabetic chronic kidney disease: Secondary | ICD-10-CM

## 2023-07-01 MED ORDER — LIRAGLUTIDE -WEIGHT MANAGEMENT 18 MG/3ML ~~LOC~~ SOPN
1.2000 mg | PEN_INJECTOR | Freq: Every day | SUBCUTANEOUS | 3 refills | Status: DC
Start: 2023-07-01 — End: 2023-07-03

## 2023-07-03 ENCOUNTER — Ambulatory Visit: Payer: Self-pay | Admitting: Physician Assistant

## 2023-07-03 ENCOUNTER — Encounter: Payer: Self-pay | Admitting: Physician Assistant

## 2023-07-03 ENCOUNTER — Ambulatory Visit
Admission: RE | Admit: 2023-07-03 | Discharge: 2023-07-03 | Disposition: A | Discharge status: Home / Self Care | Payer: 59 | Source: Ambulatory Visit | Attending: Physician Assistant | Admitting: Physician Assistant

## 2023-07-03 ENCOUNTER — Ambulatory Visit
Admission: RE | Admit: 2023-07-03 | Discharge: 2023-07-03 | Disposition: A | Discharge status: Home / Self Care | Payer: 59 | Attending: Physician Assistant | Admitting: Physician Assistant

## 2023-07-03 VITALS — BP 115/72 | HR 51 | Resp 14 | Ht 70.5 in | Wt 219.0 lb

## 2023-07-03 DIAGNOSIS — K5900 Constipation, unspecified: Secondary | ICD-10-CM

## 2023-07-03 MED ORDER — DOCUSATE SODIUM 100 MG PO CAPS: 100.0000 mg | ORAL_CAPSULE | Freq: Two times a day (BID) | ORAL | 0 refills | Status: DC

## 2023-07-03 NOTE — Progress Notes (Signed)
Subjective:     Patient ID: Ernest [name], male    DOB: 1956/03/01, 67 y.o.   MRN: [id number]  Chief Complaint  Patient presents with   Constipation    HPI Patient presents today for evaluation of constipation x 1 month. Reports experiencing gastric bloating, straining,  and the urge to defecate after meals but inability to pass stool. States LBM was 3 days and was Type 1 very small, and required a great deal of straining. Condition is exacerbated by dairy consumption.  Denies bloody, tarry stools, abdominal pain or changes in diet.  Review of Systems  Constitutional: Negative.   Respiratory: Negative.    Cardiovascular: Negative.   Gastrointestinal:  Positive for constipation. Negative for abdominal pain, blood in stool, melena, nausea and vomiting.  Genitourinary: Negative.   Musculoskeletal: Negative.   Skin: Negative.   Neurological: Negative.   Psychiatric/Behavioral: Negative.          Objective:    BP 115/72   Pulse (!) 51   Resp 14   Ht 5' 10.5" (1.791 m)   Wt 219 lb (99.3 kg)   SpO2 98%   BMI 30.98 kg/m    Physical Exam Constitutional:      Appearance: He is not ill-appearing.  Cardiovascular:     Rate and Rhythm: Normal rate and regular rhythm.  Pulmonary:     Effort: Pulmonary effort is normal.     Breath sounds: Normal breath sounds.  Musculoskeletal:        General: Normal range of motion.  Skin:    General: Skin is warm and dry.  Neurological:     General: No focal deficit present.     Mental Status: He is alert. Mental status is at baseline.  Psychiatric:        Mood and Affect: Mood normal.        Behavior: Behavior normal.        Assessment & Plan:   Problem List Items Addressed This Visit   None Visit Diagnoses     Constipation, unspecified constipation type    -  Primary   Relevant Orders   DG Abd 1 View       Meds ordered this encounter  Medications   docusate sodium (DULCOLAX PINK STOOL SOFTENER) 100 MG capsule     Sig: Take 1 capsule (100 mg total) by mouth 2 (two) times daily.    Dispense:  10 capsule    Refill:  0   Dulcolax prescribed for symptom relief, patient educated on use and need for contact to office with any concerns or needs. KUB ordered for evaluation of bowel. Patient to follow up in this office on 07/07/2023.   Delma Post, RN

## 2023-07-03 NOTE — Progress Notes (Signed)
Pt presents today with Constipated x 3 days while having some painful bloating at top of stomach while having some shortness of breath. Pt stating he keeps getting the urge but can not pass any stool. Pt also stating that for the past couple of months his stool has been hard.

## 2023-07-07 ENCOUNTER — Ambulatory Visit: Payer: Self-pay | Admitting: Physician Assistant

## 2023-07-07 ENCOUNTER — Encounter: Payer: Self-pay | Admitting: Physician Assistant

## 2023-07-07 VITALS — BP 98/58 | HR 70 | Temp 97.8°F | Resp 16

## 2023-07-07 DIAGNOSIS — K5900 Constipation, unspecified: Secondary | ICD-10-CM

## 2023-07-07 NOTE — Progress Notes (Signed)
Still c/o some abdominal distention.  Stated since visit on 10/3 he has only had BM on 10/5 x 3, but BM still hard at times.  He has not purchased nor taken any docusate sodium stating he was confused and thought a prescription was waiting for him.  Stated increased water intake.  BP checked manually and stated it's running low lately and checks it at home.  Stated he didn't take BP meds today and drank 4 water bottles today and tea.

## 2023-07-07 NOTE — Progress Notes (Signed)
Subjective:     Patient ID: Ernest [name], male    DOB: August 11, 1956, 67 y.o.   MRN: [id number]  No chief complaint on file.   HPI Patient presents for follow up on constipation. Seen in this clinic on 07/03/2023 for evaluation of gastric bloating, straining, and difficulty passing stool. On today, pt states last stool was 07/05/2023 x 3, Type 1 and very small. Patient did not retrieve prescribed docusate sodium from preferred pharmacy and has not utilized any additional agents. Abdominal X-ray on 07/03/2023 demonstrates excess stool without obstruction. Denies bloody, tarry stools, abdominal pain or changes in diet.   Review of Systems  Gastrointestinal:  Positive for constipation.   Negative except for above complaint.      Objective:    BP (!) 98/58   Pulse 70   Temp 97.8 F (36.6 C)   Resp 16   SpO2 99%    Physical Exam Constitutional:      Appearance: He is not ill-appearing.  Cardiovascular:     Rate and Rhythm: Normal rate and regular rhythm.  Pulmonary:     Effort: Pulmonary effort is normal.     Breath sounds: Normal breath sounds.  Abdominal:     General: Bowel sounds are normal. There is distension.     Palpations: Abdomen is soft.     Tenderness: There is no abdominal tenderness. There is no guarding.  Musculoskeletal:        General: Normal range of motion.  Skin:    Capillary Refill: Capillary refill takes less than 2 seconds.  Neurological:     General: No focal deficit present.     Mental Status: He is alert. Mental status is at baseline.  Psychiatric:        Mood and Affect: Mood normal.        Behavior: Behavior normal.        Assessment & Plan:   Problem List Items Addressed This Visit   None Visit Diagnoses     Constipation, unspecified constipation type    -  Primary      Patient advised to retrieve prescribed docusate sodium from pharmacy and educated on appropriate usage including dose and frequency.   Follow up in this clinic  on 07/10/2023. Advised to contact clinic with any concerns or needs.   Delma Post, RN

## 2023-07-10 ENCOUNTER — Encounter: Payer: Self-pay | Admitting: Physician Assistant

## 2023-07-10 ENCOUNTER — Ambulatory Visit: Payer: Self-pay | Admitting: Physician Assistant

## 2023-07-10 ENCOUNTER — Other Ambulatory Visit: Payer: Self-pay | Admitting: Physician Assistant

## 2023-07-10 VITALS — BP 127/69 | HR 79 | Temp 97.5°F | Resp 12

## 2023-07-10 DIAGNOSIS — K5900 Constipation, unspecified: Secondary | ICD-10-CM

## 2023-07-10 MED ORDER — POLYETHYLENE GLYCOL 3350 17 GM/SCOOP PO POWD
17.0000 g | Freq: Every day | ORAL | 2 refills | Status: DC
Start: 2023-07-10 — End: 2023-12-24

## 2023-07-10 NOTE — Progress Notes (Signed)
Subjective:     Patient ID: Ernest [name], male    DOB: [birth year], 67 y.o.   MRN: [id number]  Chief Complaint  Patient presents with   Constipation    Follow-up    HPI Patient presents to clinic for follow-up evaluation of constipation. Reports adherence with prescribed regimen and has increased full stool production with relief of bloating and discomfort.   Review of Systems  Respiratory: Negative.    Cardiovascular: Negative.   Gastrointestinal:  Positive for constipation. Negative for abdominal pain, nausea and vomiting.  Neurological: Negative.   Psychiatric/Behavioral: Negative.          Objective:    BP 127/69 (BP Location: Left Arm, Patient Position: Sitting, Cuff Size: Large)   Pulse 79   Temp (!) 97.5 F (36.4 C) (Temporal)   Resp 12   SpO2 99%    Physical Exam Constitutional:      Appearance: He is not ill-appearing.  Cardiovascular:     Rate and Rhythm: Normal rate and regular rhythm.  Pulmonary:     Effort: Pulmonary effort is normal.     Breath sounds: Normal breath sounds.  Abdominal:     General: Bowel sounds are normal. There is distension.     Palpations: Abdomen is soft.     Tenderness: There is no abdominal tenderness. There is no guarding.  Musculoskeletal:        General: Normal range of motion.  Skin:    General: Skin is warm and dry.     Capillary Refill: Capillary refill takes less than 2 seconds.  Neurological:     General: No focal deficit present.     Mental Status: He is alert. Mental status is at baseline.  Psychiatric:        Mood and Affect: Mood normal.        Behavior: Behavior normal.        Assessment & Plan:   Problem List Items Addressed This Visit   None Visit Diagnoses     Constipation, unspecified constipation type    -  Primary       Patient with increased stool production after taking docusate sodium as prescribed.   Instructed to stop usage of docusate sodium on 07/11/2023 and start polyethylene  glycol daily. Printed prescription provided to patient and submitted to preferred pharmacy.   Contact clinic with concerns or needs.   Delma Post, RN

## 2023-07-10 NOTE — Progress Notes (Signed)
Last BM - normal

## 2023-08-08 ENCOUNTER — Other Ambulatory Visit: Payer: 59

## 2023-08-08 DIAGNOSIS — Z87898 Personal history of other specified conditions: Secondary | ICD-10-CM

## 2023-08-09 LAB — HEMATOCRIT: Hematocrit: 39.2 % (ref 37.5–51.0)

## 2023-08-09 LAB — PSA: Prostate Specific Ag, Serum: 0.6 ng/mL (ref 0.0–4.0)

## 2023-08-09 LAB — TESTOSTERONE: Testosterone: 63 ng/dL — ABNORMAL LOW (ref 264–916)

## 2023-08-11 ENCOUNTER — Encounter: Payer: Self-pay | Admitting: Urology

## 2023-08-11 ENCOUNTER — Ambulatory Visit (INDEPENDENT_AMBULATORY_CARE_PROVIDER_SITE_OTHER): Payer: 59 | Admitting: Urology

## 2023-08-11 VITALS — BP 116/71 | HR 85 | Ht 70.0 in | Wt 225.0 lb

## 2023-08-11 DIAGNOSIS — E291 Testicular hypofunction: Secondary | ICD-10-CM | POA: Diagnosis not present

## 2023-08-11 NOTE — Progress Notes (Signed)
I, Ernest [name], acting as a scribe for Ernest Altes, MD., have documented all relevant documentation on the behalf of Ernest Altes, MD, as directed by Ernest Altes, MD while in the presence of Ernest Altes, MD.  08/11/2023 11:46 AM   Ernest [name] 1956-07-02 [id number]  Referring provider: Joni Reining, PA-C [address] [place],  [place] [address]  Chief Complaint  Patient presents with   Hypogonadism   Urologic history:  1.  Hypogonadism Symptoms tiredness, fatigue and decreased libido TRT testosterone cypionate 200 mg every 2 weeks with symptom improvement   2.  Abnormal PSA velocity Prostate biopsy 10/20/2020 PSA 3.3 70 g prostate; benign pathology MRI 09/28/2021 PSA bump 5.4 with 51 cc gland; PI-RADS 2 findings consistent with prostatitis  HPI: Ernest [name] is a 67 y.o. male presents for annual follow-up.   Patient states he was taking off his testosterone approximately 2 months ago after having severe constipation. He states all of his medications were stopped as the cause was not known.  Since stopping testosterone, he has noted decreased energy and libido.  Labs 08/08/23 testosterone 63 ng/dL, hematocrit 19.1, PSA 0.6  PSA trend   Prostate Specific Ag, Serum  Latest Ref Rng 0.0 - 4.0 ng/mL  01/21/2019 0.5   12/03/2019 0.3   02/18/2020 1.1   07/21/2020 2.0   10/10/2020 3.3   11/08/2020 2.2   07/27/2021 5.4 (H)   11/13/2021 4.2 (H)   01/29/2022 3.9   03/13/2022 3.3   05/02/2022 3.1   08/01/2022 1.8   02/21/2023 2.6   08/08/2023 0.6      PMH: Past Medical History:  Diagnosis Date   CKD (chronic kidney disease)    Colon polyps    Diabetes mellitus without complication (HCC)    Elevated lipids    Flat feet    Gout    Hyperkalemia    Hypertension    Over weight     Surgical History: Past Surgical History:  Procedure Laterality Date   COLONOSCOPY     COLONOSCOPY WITH PROPOFOL N/A 01/03/2023   Procedure: COLONOSCOPY WITH PROPOFOL;   Surgeon: Jaynie Collins, DO;  Location: [facility] ENDOSCOPY;  Service: Gastroenterology;  Laterality: N/A;   HERNIA REPAIR      Home Medications:  Allergies as of 08/11/2023   No Known Allergies      Medication List        Accurate as of August 11, 2023 11:46 AM. If you have any questions, ask your nurse or doctor.          STOP taking these medications    chlorthalidone 25 MG tablet Commonly known as: HYGROTON Stopped by: Ernest [name]       TAKE these medications    aspirin EC 81 MG tablet Take by mouth.   docusate sodium 100 MG capsule Commonly known as: Dulcolax Pink Stool Softener Take 1 capsule (100 mg total) by mouth 2 (two) times daily.   glipiZIDE 5 MG 24 hr tablet Commonly known as: GLUCOTROL XL Take 1 tablet (5 mg total) by mouth daily.   lisinopril 10 MG tablet Commonly known as: ZESTRIL Take 1 tablet (10 mg total) by mouth daily.   polyethylene glycol powder 17 GM/SCOOP powder Commonly known as: GLYCOLAX/MIRALAX Take 17 g by mouth daily.   testosterone cypionate 200 MG/ML injection Commonly known as: DEPOTESTOSTERONE CYPIONATE INJECT 1 ML (CC) INTRAMUSCULARLY EVERY TWO WEEKS   Victoza 18 MG/3ML Sopn Generic drug: liraglutide  Inject 1.2 mg into the skin in the morning.        Allergies: No Known Allergies  Family History: Family History  Problem Relation Age of Onset   Diabetes Mother    Diabetes Paternal Grandmother    Hypertension Paternal Grandmother    Diabetes Brother    Blindness Brother        due to DM    Social History:  reports that he has never smoked. He has never used smokeless tobacco. No history on file for alcohol use and drug use.   Physical Exam: BP 116/71   Pulse 85   Ht 5\' 10"  (1.778 m)   Wt 225 lb (102.1 kg)   BMI 32.28 kg/m   Constitutional:  Alert and oriented, No acute distress. HEENT: [place] AT, moist mucus membranes.  Trachea midline, no masses. Cardiovascular: No clubbing, cyanosis, or  edema. Respiratory: Normal respiratory effort, no increased work of breathing. GI: Abdomen is soft, nontender, nondistended, no abdominal masses Skin: No rashes, bruises or suspicious lesions. Neurologic: Grossly intact, no focal deficits, moving all 4 extremities. Psychiatric: Normal mood and affect.   Assessment & Plan:    1. Hypogonadism Current levels near castrate.  We discussed restarting TRT and he asked about other options. I do not see a recent LH and we'll draw an LH today. If his LH is not elevated, could consider a trial of Clomid. He will be notified with results and further recommendations.  [facility] [address] [place], [place] [address] [phone]

## 2023-08-12 ENCOUNTER — Other Ambulatory Visit: Payer: Self-pay | Admitting: *Deleted

## 2023-08-12 DIAGNOSIS — E291 Testicular hypofunction: Secondary | ICD-10-CM

## 2023-08-12 LAB — SPECIMEN STATUS REPORT

## 2023-08-12 LAB — LUTEINIZING HORMONE: LH: 25.9 m[IU]/mL — ABNORMAL HIGH (ref 1.7–8.6)

## 2023-08-13 ENCOUNTER — Other Ambulatory Visit: Payer: Self-pay | Admitting: Urology

## 2023-08-13 ENCOUNTER — Encounter: Payer: Self-pay | Admitting: Urology

## 2023-08-13 DIAGNOSIS — E291 Testicular hypofunction: Secondary | ICD-10-CM

## 2023-08-13 MED ORDER — TESTOSTERONE CYPIONATE 200 MG/ML IM SOLN
INTRAMUSCULAR | 0 refills | Status: DC
Start: 2023-08-13 — End: 2023-11-23

## 2023-08-22 ENCOUNTER — Ambulatory Visit: Payer: Self-pay

## 2023-08-22 DIAGNOSIS — E291 Testicular hypofunction: Secondary | ICD-10-CM

## 2023-08-22 MED ORDER — TESTOSTERONE CYPIONATE 200 MG/ML IM SOLN
200.0000 mg | INTRAMUSCULAR | Status: DC
Start: 2023-08-22 — End: 2023-11-23
  Administered 2023-08-22 – 2023-11-21 (×6): 200 mg via INTRAMUSCULAR

## 2023-08-22 NOTE — Progress Notes (Signed)
Pt presents today for bi weekly injection, testosterone LFT DEL. Pt tolerated well. Ernest [name]

## 2023-09-05 ENCOUNTER — Ambulatory Visit: Payer: Self-pay

## 2023-09-05 DIAGNOSIS — E291 Testicular hypofunction: Secondary | ICD-10-CM

## 2023-09-19 ENCOUNTER — Ambulatory Visit: Payer: Self-pay

## 2023-09-19 DIAGNOSIS — E291 Testicular hypofunction: Secondary | ICD-10-CM

## 2023-09-19 NOTE — Progress Notes (Signed)
Pt presents today for bi-weekly injection.  

## 2023-10-03 ENCOUNTER — Ambulatory Visit: Payer: Self-pay

## 2023-10-03 NOTE — Progress Notes (Deleted)
Pt completed bi-weekly injection./CL,RMA

## 2023-10-10 ENCOUNTER — Ambulatory Visit: Payer: Self-pay

## 2023-10-10 DIAGNOSIS — E291 Testicular hypofunction: Secondary | ICD-10-CM

## 2023-11-07 ENCOUNTER — Ambulatory Visit: Payer: 59

## 2023-11-07 DIAGNOSIS — E291 Testicular hypofunction: Secondary | ICD-10-CM

## 2023-11-07 NOTE — Progress Notes (Signed)
Pt completed bi-weekly injection.

## 2023-11-21 ENCOUNTER — Other Ambulatory Visit: Payer: Self-pay | Admitting: Urology

## 2023-11-21 ENCOUNTER — Ambulatory Visit: Payer: Self-pay

## 2023-11-21 DIAGNOSIS — E291 Testicular hypofunction: Secondary | ICD-10-CM

## 2023-11-24 ENCOUNTER — Other Ambulatory Visit: Payer: Self-pay | Admitting: *Deleted

## 2023-11-24 ENCOUNTER — Encounter: Payer: Self-pay | Admitting: *Deleted

## 2023-11-24 DIAGNOSIS — E291 Testicular hypofunction: Secondary | ICD-10-CM

## 2023-12-02 ENCOUNTER — Other Ambulatory Visit: Payer: 59

## 2023-12-02 DIAGNOSIS — E291 Testicular hypofunction: Secondary | ICD-10-CM

## 2023-12-03 LAB — TESTOSTERONE: Testosterone: 363 ng/dL (ref 264–916)

## 2023-12-04 ENCOUNTER — Encounter: Payer: Self-pay | Admitting: *Deleted

## 2023-12-05 ENCOUNTER — Ambulatory Visit: Payer: Self-pay

## 2023-12-05 DIAGNOSIS — E291 Testicular hypofunction: Secondary | ICD-10-CM

## 2023-12-05 MED ORDER — TESTOSTERONE CYPIONATE 200 MG/ML IM SOLN
200.0000 mg | INTRAMUSCULAR | Status: DC
Start: 2023-12-05 — End: 2024-01-19
  Administered 2023-12-05 – 2024-01-15 (×3): 200 mg via INTRAMUSCULAR

## 2023-12-19 ENCOUNTER — Ambulatory Visit: Payer: Self-pay

## 2023-12-19 DIAGNOSIS — Z0000 Encounter for general adult medical examination without abnormal findings: Secondary | ICD-10-CM

## 2023-12-19 LAB — POCT URINALYSIS DIPSTICK
Bilirubin, UA: NEGATIVE
Blood, UA: NEGATIVE
Glucose, UA: NEGATIVE
Ketones, UA: NEGATIVE
Leukocytes, UA: NEGATIVE
Nitrite, UA: NEGATIVE
Protein, UA: NEGATIVE
Spec Grav, UA: 1.02 (ref 1.010–1.025)
Urobilinogen, UA: 0.2 U/dL
pH, UA: 6 (ref 5.0–8.0)

## 2023-12-19 NOTE — Progress Notes (Signed)
 Pt completed labs for physical and bi weekly injection.

## 2023-12-21 LAB — CMP12+LP+TP+TSH+6AC+PSA+CBC…
ALT: 32 IU/L (ref 0–44)
AST: 39 IU/L (ref 0–40)
Albumin: 4.1 g/dL (ref 3.9–4.9)
Alkaline Phosphatase: 190 IU/L — ABNORMAL HIGH (ref 44–121)
BUN/Creatinine Ratio: 15 (ref 10–24)
BUN: 28 mg/dL — ABNORMAL HIGH (ref 8–27)
Basophils Absolute: 0 10*3/uL (ref 0.0–0.2)
Basos: 1 %
Bilirubin Total: 0.6 mg/dL (ref 0.0–1.2)
Calcium: 9.3 mg/dL (ref 8.6–10.2)
Chloride: 102 mmol/L (ref 96–106)
Chol/HDL Ratio: 4 ratio (ref 0.0–5.0)
Cholesterol, Total: 214 mg/dL — ABNORMAL HIGH (ref 100–199)
Creatinine, Ser: 1.82 mg/dL — ABNORMAL HIGH (ref 0.76–1.27)
EOS (ABSOLUTE): 0.2 10*3/uL (ref 0.0–0.4)
Eos: 3 %
Estimated CHD Risk: 0.8 times avg. (ref 0.0–1.0)
Free Thyroxine Index: 2.2 (ref 1.2–4.9)
GGT: 418 IU/L — ABNORMAL HIGH (ref 0–65)
Globulin, Total: 3.2 g/dL (ref 1.5–4.5)
Glucose: 114 mg/dL — ABNORMAL HIGH (ref 70–99)
HDL: 53 mg/dL (ref >39)
Hematocrit: 43.7 % (ref 37.5–51.0)
Hemoglobin: 14.2 g/dL (ref 13.0–17.7)
Immature Grans (Abs): 0 10*3/uL (ref 0.0–0.1)
Immature Granulocytes: 1 %
Iron: 53 ug/dL (ref 38–169)
LDH: 216 IU/L (ref 121–224)
LDL Chol Calc (NIH): 144 mg/dL — ABNORMAL HIGH (ref 0–99)
Lymphocytes Absolute: 0.6 10*3/uL — ABNORMAL LOW (ref 0.7–3.1)
Lymphs: 11 %
MCH: 27.5 pg (ref 26.6–33.0)
MCHC: 32.5 g/dL (ref 31.5–35.7)
MCV: 85 fL (ref 79–97)
Monocytes Absolute: 0.9 10*3/uL (ref 0.1–0.9)
Monocytes: 17 %
Neutrophils Absolute: 3.5 10*3/uL (ref 1.4–7.0)
Neutrophils: 67 %
Phosphorus: 3.4 mg/dL (ref 2.8–4.1)
Platelets: 196 10*3/uL (ref 150–450)
Potassium: 5.3 mmol/L — ABNORMAL HIGH (ref 3.5–5.2)
Prostate Specific Ag, Serum: 1.5 ng/mL (ref 0.0–4.0)
RBC: 5.17 x10E6/uL (ref 4.14–5.80)
RDW: 16.5 % — ABNORMAL HIGH (ref 11.6–15.4)
Sodium: 138 mmol/L (ref 134–144)
T3 Uptake Ratio: 30 % (ref 24–39)
T4, Total: 7.4 ug/dL (ref 4.5–12.0)
TSH: 1.5 u[IU]/mL (ref 0.450–4.500)
Total Protein: 7.3 g/dL (ref 6.0–8.5)
Triglycerides: 94 mg/dL (ref 0–149)
Uric Acid: 8.5 mg/dL — ABNORMAL HIGH (ref 3.8–8.4)
VLDL Cholesterol Cal: 17 mg/dL (ref 5–40)
WBC: 5.2 10*3/uL (ref 3.4–10.8)
eGFR: 40 mL/min/{1.73_m2} — ABNORMAL LOW (ref >59)

## 2023-12-21 LAB — MICROALBUMIN / CREATININE URINE RATIO
Creatinine, Urine: 119.3 mg/dL
Microalb/Creat Ratio: 7 mg/g{creat} (ref 0–29)
Microalbumin, Urine: 8.4 ug/mL

## 2023-12-21 LAB — HGB A1C W/O EAG: Hgb A1c MFr Bld: 6.2 % — ABNORMAL HIGH (ref 4.8–5.6)

## 2023-12-24 ENCOUNTER — Ambulatory Visit: Payer: Self-pay | Admitting: Physician Assistant

## 2023-12-24 ENCOUNTER — Encounter: Payer: Self-pay | Admitting: Physician Assistant

## 2023-12-24 VITALS — BP 135/83 | HR 81 | Temp 97.8°F | Resp 14 | Ht 70.5 in | Wt 232.0 lb

## 2023-12-24 DIAGNOSIS — Z0000 Encounter for general adult medical examination without abnormal findings: Secondary | ICD-10-CM

## 2023-12-24 NOTE — Progress Notes (Signed)
 Pt presents today complete physical, Pt denies any issues or concerns at this time. Ernest [name]

## 2023-12-24 NOTE — Progress Notes (Signed)
 [facility] ____________________________________________   None    (approximate)  I have reviewed the triage vital signs and the nursing notes.   HISTORY  Chief Complaint No chief complaint on file.   HPI Ernest [name] is a 68 y.o. male patient presents for annual physical exam.  Voices no concerns or complaints.         Past Medical History:  Diagnosis Date   CKD (chronic kidney disease)    Colon polyps    Diabetes mellitus without complication (HCC)    Elevated lipids    Flat feet    Gout    Hyperkalemia    Hypertension    Over weight     Patient Active Problem List   Diagnosis Date Noted   OSA on CPAP 08/06/2021   CPAP use counseling 08/06/2021   OSA (obstructive sleep apnea) 04/09/2021   Other hypersomnia 04/09/2021   Essential hypertension 06/08/2019   Type 2 diabetes mellitus with stage 3 chronic kidney disease, without long-term current use of insulin (HCC) 06/08/2019   Chronic kidney disease (CKD) stage G3a/A2, moderately decreased glomerular filtration rate (GFR) between 45-59 mL/min/1.73 square meter and albuminuria creatinine ratio between 30-299 mg/g 06/08/2019   Class 2 severe obesity due to excess calories with serious comorbidity and body mass index (BMI) of 38.0 to 38.9 in adult [facility]) 06/08/2019   Hypogonadism in male 06/08/2019   At risk for obstructive sleep apnea 06/08/2019   Other hyperlipidemia 06/08/2019   Persistent proteinuria 03/24/2018    Past Surgical History:  Procedure Laterality Date   COLONOSCOPY     COLONOSCOPY WITH PROPOFOL N/A 01/03/2023   Procedure: COLONOSCOPY WITH PROPOFOL;  Surgeon: Jaynie Collins, DO;  Location: [facility] ENDOSCOPY;  Service: Gastroenterology;  Laterality: N/A;   HERNIA REPAIR      Prior to Admission medications   Medication Sig Start Date End Date Taking? Authorizing Provider  aspirin EC 81 MG tablet Take by mouth.    [provider]  docusate sodium  (DULCOLAX PINK STOOL SOFTENER) 100 MG capsule Take 1 capsule (100 mg total) by mouth 2 (two) times daily. 07/03/23   Joni Reining, PA-C  glipiZIDE (GLUCOTROL XL) 5 MG 24 hr tablet Take 1 tablet (5 mg total) by mouth daily. 05/08/23   Joni Reining, PA-C  lisinopril (ZESTRIL) 10 MG tablet Take 1 tablet (10 mg total) by mouth daily. 02/04/23   Joni Reining, PA-C  polyethylene glycol powder (GLYCOLAX/MIRALAX) 17 GM/SCOOP powder Take 17 g by mouth daily. 07/10/23   Joni Reining, PA-C  testosterone cypionate (DEPOTESTOSTERONE CYPIONATE) 200 MG/ML injection INJECT 1 ML INTO THE MUSCLE EVERY 2 WEEKS 11/23/23   Stoioff, Verna Czech, MD  VICTOZA 18 MG/3ML SOPN Inject 1.2 mg into the skin in the morning. 07/01/23   [provider]    Allergies Patient has no known allergies.  Family History  Problem Relation Age of Onset   Diabetes Mother    Diabetes Paternal Grandmother    Hypertension Paternal Grandmother    Diabetes Brother    Blindness Brother        due to DM    Social History Social History   Tobacco Use   Smoking status: Never   Smokeless tobacco: Never  Vaping Use   Vaping status: Never Used    Review of Systems Constitutional: No fever/chills Eyes: No visual changes. ENT: No sore throat. Cardiovascular: Denies chest pain. Respiratory: Denies shortness of breath. Gastrointestinal: No abdominal pain.  No nausea, no vomiting.  No diarrhea.  No constipation. Genitourinary: Negative for dysuria. Musculoskeletal: Negative for back pain. Skin: Negative for rash. Neurological: Negative for headaches, focal weakness or numbness. Endocrine: Chronic kidney disease, diabetes, gout, hyperkalemia, hypertension, and hypogonadism ____________________________________________   PHYSICAL EXAM:  VITAL SIGNS: BP 135/83  Cuff Size Normal  Pulse Rate 81  Temp 97.8 F (36.6 C)  Temp Source Temporal  Weight 232 lb (105.2 kg)  Height 5' 10.5" (1.791 m)  Resp 14  SpO2 99 %    Other Vitals   BMI: 32.82 kg/m2  BSA: 2.29 m2   Constitutional: Alert and oriented. Well appearing and in no acute distress. Eyes: Conjunctivae are normal. PERRL. EOMI. Head: Atraumatic. Nose: No congestion/rhinnorhea. Mouth/Throat: Mucous membranes are moist.  Oropharynx non-erythematous. Neck: No stridor.  No cervical spine tenderness to palpation. Hematological/Lymphatic/Immunilogical: No cervical lymphadenopathy. Cardiovascular: Normal rate, regular rhythm. Grossly normal heart sounds.  Good peripheral circulation. Respiratory: Normal respiratory effort.  No retractions. Lungs CTAB. Gastrointestinal: Soft and nontender.  Distention secondary body habitus. No abdominal bruits. No CVA tenderness. Genitourinary: Deferred Musculoskeletal: No lower extremity tenderness nor edema.  No joint effusions. Neurologic:  Normal speech and language. No gross focal neurologic deficits are appreciated. No gait instability. Skin:  Skin is warm, dry and intact. No rash noted. Psychiatric: Mood and affect are normal. Speech and behavior are normal.  ____________________________________________   LABS          Component Ref Range & Units (hover) 5 d ago (12/19/23) 10 mo ago (02/25/23) 10 mo ago (02/21/23) 1 yr ago (03/13/22) 2 yr ago (09/07/21) 3 yr ago (11/08/20)  Color, UA yellow Dark Amber VC dark yellow yellow yellow  Clarity, UA clear Clear VC clear clear clear  Glucose, UA Negative Negative VC, R Negative Negative Negative  Bilirubin, UA neg Negative VC negative negative negative  Ketones, UA neg Negative VC negative negative +-  Spec Grav, UA 1.020 >=1.030 Abnormal  VC, R >=1.030 Abnormal  1.020 1.020  Blood, UA neg Negative  Abnormal  VC negative negative negative  pH, UA 6.0 5.5 VC, R 5.5 5.0 6.0  Protein, UA Negative Negative VC, R Negative Negative Negative  Urobilinogen, UA 0.2 0.2 VC, R 0.2 0.2 0.2  Nitrite, UA neg Negative VC negative negative negative  Leukocytes, UA Negative  Negative VC, R Negative Negative Negative  Appearance     normal light  Odor                   View All Conversations on this Encounter              Component Ref Range & Units (hover) 5 d ago (12/19/23) 4 mo ago (08/08/23) 4 mo ago (08/08/23) 10 mo ago (02/21/23) 10 mo ago (02/06/23) 1 yr ago (08/01/22) 1 yr ago (08/01/22)  Glucose 114 High    82     Uric Acid 8.5 High    9.6 High  CM     Comment:            Therapeutic target for gout patients: <6.0  BUN 28 High    25     Creatinine, Ser 1.82 High    1.88 High      eGFR 40 Low    39 Low      BUN/Creatinine Ratio 15   13     Sodium 138   136     Potassium 5.3 High    4.7  Chloride 102   97     Calcium 9.3   9.5     Phosphorus 3.4   3.8     Total Protein 7.3   7.3     Albumin 4.1   3.9     Globulin, Total 3.2   3.4     Bilirubin Total 0.6   0.6     Alkaline Phosphatase 190 High    112     LDH 216   228 High      AST 39   21     ALT 32   16     GGT 418 High    112 High      Iron 53   38     Cholesterol, Total 214 High    180     Triglycerides 94   79     HDL 53   40     VLDL Cholesterol Cal 17   15     LDL Chol Calc (NIH) 144 High    125 High      Chol/HDL Ratio 4.0   4.5 CM     Comment:                                   T. Chol/HDL Ratio                                             Men  Women                               1/2 Avg.Risk  3.4    3.3                                   Avg.Risk  5.0    4.4                                2X Avg.Risk  9.6    7.1                                3X Avg.Risk 23.4   11.0  Estimated CHD Risk 0.8   0.9 CM     Comment: The CHD Risk is based on the T. Chol/HDL ratio. Other factors affect CHD Risk such as hypertension, smoking, diabetes, severe obesity, and family history of premature CHD.  TSH 1.500   1.340     T4, Total 7.4   7.3     T3 Uptake Ratio 30   35     Free Thyroxine Index 2.2   2.6     Prostate Specific Ag, Serum 1.5  0.6 CM 2.6 CM  1.8 CM   Comment: Roche ECLIA  methodology. According to the American Urological Association, Serum PSA should decrease and remain at undetectable levels after radical prostatectomy. The AUA defines biochemical recurrence as an initial PSA value 0.2 ng/mL or greater followed by a subsequent confirmatory PSA value 0.2 ng/mL or greater. Values obtained with different assay methods or kits cannot be used interchangeably. Results cannot be interpreted  as absolute evidence of the presence or absence of malignant disease.  WBC 5.2   6.3     RBC 5.17   5.58     Hemoglobin 14.2   14.4     Hematocrit 43.7 39.2  43.4 46.4  46.4  MCV 85   78 Low      MCH 27.5   25.8 Low      MCHC 32.5   33.2     RDW 16.5 High    15.9 High      Platelets 196   241     Neutrophils 67   71     Lymphs 11   8     Monocytes 17   18     Eos 3   2     Basos 1   0     Neutrophils Absolute 3.5   4.5     Lymphocytes Absolute 0.6 Low    0.5 Low      Monocytes Absolute 0.9   1.1 High      EOS (ABSOLUTE) 0.2   0.1     Basophils Absolute 0.0   0.0     Immature Granulocytes 1   1     Immature Grans (Abs) 0.0   0.0     Resulting Agency [facility]          View All Conversations on this Encounter              Component Ref Range & Units (hover) 5 d ago (12/19/23) 10 mo ago (02/21/23) 1 yr ago (03/13/22) 3 yr ago (11/08/20) 4 yr ago (06/08/19) 4 yr ago (01/21/19)  Creatinine, Urine 119.3 CANCELED R, CM CANCELED R, CM 160.1 117.4 134.3  Microalbumin, Urine 8.4 CANCELED R, CM CANCELED R, CM 8.1 3.3 3.8  Microalb/Creat Ratio 7  CANCELED R, CM 5 CM 3 CM 3 CM  Comment:                        Normal:                0 -  29                        Moderately increased: 30 - 300                        Severely increased:       >300  Resulting Agency [facility]          View All Conversations on this Encounter                Component Ref Range & Units (hover) 5 d  ago (12/19/23) 7 mo ago (05/28/23) 10 mo ago (02/21/23) 1 yr ago (09/12/22) 1 yr ago (03/13/22) 2 yr ago (07/12/21) 2 yr ago (03/14/21)  Hgb A1c MFr Bld 6.2 High  6.7 High  CM 6.3 High  CM 6.1 High  CM 5.9 High  CM 6.3 High  CM 7.8 High  CM  Comment:          Prediabetes: 5.7 - 6.4          Diabetes: >6.4          Glycemic control for adults with diabetes: <7.0             ____________________________________________  EKG  Sinus  rhythm 81 bpm ____________________________________________    ____________________________________________   INITIAL IMPRESSION / ASSESSMENT AND PLAN / ED COURSE  As part of my medical decision making, I reviewed the following data within the electronic MEDICAL RECORD NUMBER       No acute findings on physical exam and EKG.  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     ____________________________________________   FINAL CLINICAL IMPRESSION  Well exam  ED Discharge Orders     None        Note:  This document was prepared using Dragon voice recognition software and may include unintentional dictation errors.

## 2024-01-15 ENCOUNTER — Ambulatory Visit: Payer: Self-pay

## 2024-01-15 DIAGNOSIS — E291 Testicular hypofunction: Secondary | ICD-10-CM

## 2024-01-18 ENCOUNTER — Other Ambulatory Visit: Payer: Self-pay | Admitting: Urology

## 2024-01-18 DIAGNOSIS — E291 Testicular hypofunction: Secondary | ICD-10-CM

## 2024-01-30 ENCOUNTER — Ambulatory Visit: Payer: Self-pay

## 2024-01-30 DIAGNOSIS — E291 Testicular hypofunction: Secondary | ICD-10-CM

## 2024-01-30 MED ORDER — TESTOSTERONE CYPIONATE 100 MG/ML IM SOLN
200.0000 mg | INTRAMUSCULAR | Status: DC
  Administered 2024-01-30 – 2024-04-09 (×5): 200 mg via INTRAMUSCULAR

## 2024-01-30 NOTE — Progress Notes (Signed)
Pt completed bi-weekly injection.

## 2024-02-13 ENCOUNTER — Ambulatory Visit: Payer: Self-pay

## 2024-02-13 DIAGNOSIS — E291 Testicular hypofunction: Secondary | ICD-10-CM

## 2024-03-12 ENCOUNTER — Ambulatory Visit: Payer: Self-pay

## 2024-03-12 DIAGNOSIS — E291 Testicular hypofunction: Secondary | ICD-10-CM

## 2024-03-26 ENCOUNTER — Ambulatory Visit: Payer: Self-pay

## 2024-03-26 DIAGNOSIS — E291 Testicular hypofunction: Secondary | ICD-10-CM

## 2024-03-26 NOTE — Progress Notes (Signed)
Pt received bi-weekly injection.

## 2024-04-09 ENCOUNTER — Ambulatory Visit

## 2024-04-09 DIAGNOSIS — E291 Testicular hypofunction: Secondary | ICD-10-CM

## 2024-05-24 ENCOUNTER — Ambulatory Visit: Payer: Self-pay

## 2024-05-24 NOTE — Progress Notes (Signed)
 Presents [facility] - tripped over concrete & scraped right knee & underside of right forearm.  2 x 1/2 abrasion on right knee and 3 small abrasions on underside of right forearm.  Cleansed with 1/2 strength H2O2.  Aquaphor applied.  Non-stick dressing with coban applied to both sites.  No S/Sx of infection.  JINNY Curry, RN took pictures of abrasions.  Advised to return prn.

## 2024-06-03 ENCOUNTER — Other Ambulatory Visit: Payer: Self-pay

## 2024-06-03 DIAGNOSIS — E291 Testicular hypofunction: Secondary | ICD-10-CM

## 2024-06-03 DIAGNOSIS — Z87898 Personal history of other specified conditions: Secondary | ICD-10-CM

## 2024-06-03 DIAGNOSIS — R972 Elevated prostate specific antigen [PSA]: Secondary | ICD-10-CM

## 2024-06-04 ENCOUNTER — Other Ambulatory Visit

## 2024-06-09 ENCOUNTER — Other Ambulatory Visit

## 2024-06-09 ENCOUNTER — Ambulatory Visit: Admitting: Urology

## 2024-06-09 DIAGNOSIS — Z87898 Personal history of other specified conditions: Secondary | ICD-10-CM

## 2024-06-09 DIAGNOSIS — E291 Testicular hypofunction: Secondary | ICD-10-CM

## 2024-06-09 DIAGNOSIS — R972 Elevated prostate specific antigen [PSA]: Secondary | ICD-10-CM

## 2024-06-10 ENCOUNTER — Other Ambulatory Visit: Payer: Self-pay | Admitting: Physician Assistant

## 2024-06-10 DIAGNOSIS — N1831 Chronic kidney disease, stage 3a: Secondary | ICD-10-CM

## 2024-06-10 DIAGNOSIS — I10 Essential (primary) hypertension: Secondary | ICD-10-CM

## 2024-06-10 LAB — PSA: Prostate Specific Ag, Serum: 0.9 ng/mL (ref 0.0–4.0)

## 2024-06-10 LAB — HEMOGLOBIN AND HEMATOCRIT, BLOOD
Hematocrit: 46.7 % (ref 37.5–51.0)
Hemoglobin: 14.6 g/dL (ref 13.0–17.7)

## 2024-06-10 LAB — TESTOSTERONE: Testosterone: 77 ng/dL — ABNORMAL LOW (ref 264–916)

## 2024-06-11 ENCOUNTER — Ambulatory Visit: Payer: Self-pay

## 2024-06-11 DIAGNOSIS — E291 Testicular hypofunction: Secondary | ICD-10-CM

## 2024-06-11 MED ORDER — TESTOSTERONE CYPIONATE 200 MG/ML IM SOLN: 200.0000 mg | INTRAMUSCULAR | Status: DC

## 2024-06-14 ENCOUNTER — Ambulatory Visit (INDEPENDENT_AMBULATORY_CARE_PROVIDER_SITE_OTHER): Admitting: Physician Assistant

## 2024-06-14 ENCOUNTER — Encounter: Payer: Self-pay | Admitting: Physician Assistant

## 2024-06-14 VITALS — BP 110/67 | HR 101 | Ht 70.0 in | Wt 232.0 lb

## 2024-06-14 DIAGNOSIS — E291 Testicular hypofunction: Secondary | ICD-10-CM | POA: Diagnosis not present

## 2024-06-14 DIAGNOSIS — R972 Elevated prostate specific antigen [PSA]: Secondary | ICD-10-CM

## 2024-06-14 MED ORDER — TESTOSTERONE CYPIONATE 200 MG/ML IM SOLN: INTRAMUSCULAR | Status: DC

## 2024-06-15 NOTE — Progress Notes (Signed)
 06/14/2024 12:57 PM   Ernest [name] 1956/08/23 [id number]  CC: Chief Complaint  Patient presents with   Hypogonadism   HPI: Ernest [name] is a 68 y.o. male with PMH hypogonadism on TRT and abnormal PSA velocity with benign biopsy in 2022 who presents today for follow-up.   Most recent labs notable for normal hematocrit, 46.7, decreased PSA, 0.9, and subtherapeutic testosterone , 77.  Today he reports he missed a dose of IM testosterone  cypionate at the time of his recent labs.  He typically gets his injections every 2 weeks with a nurse at his workplace.  He finds that he feels rather slumpy toward the end of his dosing period.  He has persistent low energy and poor libido despite TRT.  PMH: Past Medical History:  Diagnosis Date   CKD (chronic kidney disease)    Colon polyps    Diabetes mellitus without complication (HCC)    Elevated lipids    Flat feet    Gout    Hyperkalemia    Hypertension    Over weight     Surgical History: Past Surgical History:  Procedure Laterality Date   COLONOSCOPY     COLONOSCOPY WITH PROPOFOL  N/A 01/03/2023   Procedure: COLONOSCOPY WITH PROPOFOL ;  Surgeon: Onita Elspeth Sharper, DO;  Location: [facility] ENDOSCOPY;  Service: Gastroenterology;  Laterality: N/A;   HERNIA REPAIR      Home Medications:  Allergies as of 06/14/2024   No Known Allergies      Medication List        Accurate as of June 14, 2024 11:59 PM. If you have any questions, ask your nurse or doctor.          aspirin EC 81 MG tablet Take by mouth.   chlorthalidone  25 MG tablet Commonly known as: HYGROTON  TAKE 1/2 (ONE-HALF) TABLET BY MOUTH IN THE MORNING   glipiZIDE  5 MG 24 hr tablet Commonly known as: GLUCOTROL  XL Take 1 tablet (5 mg total) by mouth daily.   liraglutide  18 MG/3ML Sopn Commonly known as: VICTOZA  INJECT 1.2MG  SUBCUTANEOUSLY ONCE DAILY   lisinopril  10 MG tablet Commonly known as: ZESTRIL  Take 1 tablet (10 mg total) by mouth daily.    testosterone  cypionate 200 MG/ML injection Commonly known as: DEPOTESTOSTERONE CYPIONATE INJECT 0.5 ML INTO THE MUSCLE EVERY 7 DAYS What changed: See the new instructions. Changed by: Lucie Hones        Allergies:  No Known Allergies  Family History: Family History  Problem Relation Age of Onset   Diabetes Mother    Diabetes Paternal Grandmother    Hypertension Paternal Grandmother    Diabetes Brother    Blindness Brother        due to DM    Social History:   reports that he has never smoked. He has never used smokeless tobacco. No history on file for alcohol use and drug use.  Physical Exam: BP 110/67 (BP Location: Left Arm, Patient Position: Sitting, Cuff Size: Large)   Pulse (!) 101   Ht 5' 10 (1.778 m)   Wt 232 lb (105.2 kg)   BMI 33.29 kg/m   Constitutional:  Alert and oriented, no acute distress, nontoxic appearing HEENT: [place], AT Cardiovascular: No clubbing, cyanosis, or edema Respiratory: Normal respiratory effort, no increased work of breathing Skin: No rashes, bruises or suspicious lesions Neurologic: Grossly intact, no focal deficits, moving all 4 extremities Psychiatric: Normal mood and affect  Laboratory Data: Results for orders placed or performed in visit on 06/09/24  PSA   Collection Time: 06/09/24  9:28 AM  Result Value Ref Range   Prostate Specific Ag, Serum 0.9 0.0 - 4.0 ng/mL  Testosterone    Collection Time: 06/09/24  9:28 AM  Result Value Ref Range   Testosterone  77 (L) 264 - 916 ng/dL  Hemoglobin and hematocrit, blood   Collection Time: 06/09/24  9:28 AM  Result Value Ref Range   Hemoglobin 14.6 13.0 - 17.7 g/dL   Hematocrit 53.2 62.4 - 51.0 %   Assessment & Plan:   1. Hypogonadism in male Most recent testosterone  was subtherapeutic in the setting of having missed a dose.  That said, he is having worsened fatigue toward the end of his dosing.  And notes persistent low libido and low energy despite TRT.  Will have him  adjust his current dosing from 200 mg every 14 days to 100 mg every 7 days and repeat labs at the midpoint between injections in about 4 weeks.  May adjust dosing at that time. - testosterone  cypionate (DEPOTESTOSTERONE CYPIONATE) 200 MG/ML injection; INJECT 0.5 ML INTO THE MUSCLE EVERY 7 DAYS - Testosterone ; Future  2. Abnormal PSA (Primary) PSA decreased, will continue to monitor.  Return in about 4 weeks (around 07/12/2024) for Lab visit for testosterone .  Loyalty Arentz, PA-C  [facility] [address], [address] [place], [place] [address] [phone]

## 2024-06-18 ENCOUNTER — Ambulatory Visit: Payer: Self-pay

## 2024-06-18 DIAGNOSIS — E291 Testicular hypofunction: Secondary | ICD-10-CM

## 2024-06-18 MED ORDER — TESTOSTERONE CYPIONATE 200 MG/ML IM SOLN
100.0000 mg | INTRAMUSCULAR | Status: AC
  Administered 2024-06-18 – 2024-09-10 (×7): 100 mg via INTRAMUSCULAR

## 2024-06-18 NOTE — Progress Notes (Signed)
 Presents to [facility] stating Urologist changed dosing instructions for Testosterone  injections.  He had a recent appointment at Dr. Neale.  Dose is now 100 mg IM q week  New standing order for injections put in Epic

## 2024-06-25 ENCOUNTER — Ambulatory Visit: Payer: Self-pay

## 2024-06-25 DIAGNOSIS — E291 Testicular hypofunction: Secondary | ICD-10-CM

## 2024-07-02 ENCOUNTER — Ambulatory Visit: Payer: Self-pay

## 2024-07-02 DIAGNOSIS — E291 Testicular hypofunction: Secondary | ICD-10-CM

## 2024-07-09 ENCOUNTER — Ambulatory Visit: Payer: Self-pay

## 2024-07-09 DIAGNOSIS — E291 Testicular hypofunction: Secondary | ICD-10-CM

## 2024-07-14 ENCOUNTER — Other Ambulatory Visit

## 2024-07-14 DIAGNOSIS — E291 Testicular hypofunction: Secondary | ICD-10-CM

## 2024-07-15 ENCOUNTER — Ambulatory Visit: Payer: Self-pay | Admitting: Physician Assistant

## 2024-07-15 LAB — TESTOSTERONE: Testosterone: 939 ng/dL — ABNORMAL HIGH (ref 264–916)

## 2024-07-23 ENCOUNTER — Other Ambulatory Visit: Payer: Self-pay

## 2024-07-23 DIAGNOSIS — E291 Testicular hypofunction: Secondary | ICD-10-CM

## 2024-07-23 NOTE — Progress Notes (Signed)
 PT RECEIVED WEEKLY INJECTION 0.5ML

## 2024-07-28 ENCOUNTER — Other Ambulatory Visit: Payer: Self-pay

## 2024-07-28 DIAGNOSIS — E291 Testicular hypofunction: Secondary | ICD-10-CM

## 2024-07-28 DIAGNOSIS — Z87898 Personal history of other specified conditions: Secondary | ICD-10-CM

## 2024-07-29 ENCOUNTER — Other Ambulatory Visit: Payer: Self-pay

## 2024-07-29 DIAGNOSIS — E291 Testicular hypofunction: Secondary | ICD-10-CM

## 2024-07-29 DIAGNOSIS — R972 Elevated prostate specific antigen [PSA]: Secondary | ICD-10-CM

## 2024-07-29 DIAGNOSIS — Z87898 Personal history of other specified conditions: Secondary | ICD-10-CM

## 2024-08-06 ENCOUNTER — Other Ambulatory Visit: Payer: Self-pay | Admitting: Urology

## 2024-08-06 ENCOUNTER — Ambulatory Visit: Payer: Self-pay

## 2024-08-06 DIAGNOSIS — E291 Testicular hypofunction: Secondary | ICD-10-CM

## 2024-08-20 ENCOUNTER — Ambulatory Visit: Payer: Self-pay

## 2024-08-20 DIAGNOSIS — E291 Testicular hypofunction: Secondary | ICD-10-CM

## 2024-08-28 ENCOUNTER — Other Ambulatory Visit: Payer: Self-pay | Admitting: Physician Assistant

## 2024-08-28 DIAGNOSIS — I10 Essential (primary) hypertension: Secondary | ICD-10-CM

## 2024-09-08 ENCOUNTER — Other Ambulatory Visit

## 2024-09-10 ENCOUNTER — Ambulatory Visit: Payer: Self-pay

## 2024-09-10 DIAGNOSIS — E291 Testicular hypofunction: Secondary | ICD-10-CM

## 2024-09-10 NOTE — Progress Notes (Signed)
 Presents to [facility] stating Urologist changed dosing instructions for Testosterone  injections.  He had a recent appointment at Dr. Neale.   Dose is now 100 mg IM q week

## 2024-09-13 ENCOUNTER — Other Ambulatory Visit

## 2024-09-13 DIAGNOSIS — E291 Testicular hypofunction: Secondary | ICD-10-CM

## 2024-09-13 DIAGNOSIS — R972 Elevated prostate specific antigen [PSA]: Secondary | ICD-10-CM

## 2024-09-14 ENCOUNTER — Ambulatory Visit: Payer: Self-pay | Admitting: Physician Assistant

## 2024-09-14 LAB — HEMOGLOBIN AND HEMATOCRIT, BLOOD
Hematocrit: 49.2 % (ref 37.5–51.0)
Hemoglobin: 15.7 g/dL (ref 13.0–17.7)

## 2024-09-14 LAB — TESTOSTERONE: Testosterone: 1126 ng/dL — ABNORMAL HIGH (ref 264–916)

## 2024-09-14 LAB — PSA: Prostate Specific Ag, Serum: 3.5 ng/mL (ref 0.0–4.0)

## 2024-10-01 ENCOUNTER — Ambulatory Visit

## 2024-10-01 DIAGNOSIS — E291 Testicular hypofunction: Secondary | ICD-10-CM

## 2024-10-01 MED ORDER — TESTOSTERONE CYPIONATE 200 MG/ML IM SOLN
100.0000 mg | INTRAMUSCULAR | Status: AC
  Administered 2024-10-01 – 2024-10-22 (×3): 100 mg via INTRAMUSCULAR

## 2024-10-08 ENCOUNTER — Ambulatory Visit: Payer: Self-pay

## 2024-10-08 DIAGNOSIS — E291 Testicular hypofunction: Secondary | ICD-10-CM

## 2024-10-22 ENCOUNTER — Ambulatory Visit: Payer: Self-pay

## 2024-10-22 DIAGNOSIS — E291 Testicular hypofunction: Secondary | ICD-10-CM
